# Patient Record
Sex: Female | Born: 1970 | Race: White | Hispanic: No | Marital: Single | Smoking: Former smoker
Health system: Southern US, Community
[De-identification: ages and names within clinical notes are randomized; demographics above are authoritative.]

## PROBLEM LIST (undated history)

## (undated) DIAGNOSIS — R011 Cardiac murmur, unspecified: Secondary | ICD-10-CM

## (undated) DIAGNOSIS — J439 Emphysema, unspecified: Secondary | ICD-10-CM

## (undated) DIAGNOSIS — M1990 Unspecified osteoarthritis, unspecified site: Secondary | ICD-10-CM

## (undated) DIAGNOSIS — I341 Nonrheumatic mitral (valve) prolapse: Secondary | ICD-10-CM

## (undated) DIAGNOSIS — R0902 Hypoxemia: Secondary | ICD-10-CM

## (undated) DIAGNOSIS — J449 Chronic obstructive pulmonary disease, unspecified: Secondary | ICD-10-CM

## (undated) HISTORY — DX: Cardiac murmur, unspecified: R01.1

## (undated) HISTORY — DX: Hypoxemia: R09.02

## (undated) HISTORY — DX: Emphysema, unspecified: J43.9

## (undated) HISTORY — DX: Unspecified osteoarthritis, unspecified site: M19.90

## (undated) HISTORY — DX: Chronic obstructive pulmonary disease, unspecified: J44.9

## (undated) HISTORY — PX: TYMPANOSTOMY TUBE PLACEMENT: SHX32

## (undated) HISTORY — PX: ABDOMINAL HYSTERECTOMY: SUR658

## (undated) HISTORY — PX: ADENOIDECTOMY: SUR15

---

## 1997-06-18 ENCOUNTER — Other Ambulatory Visit: Admission: RE | Admit: 1997-06-18 | Discharge: 1997-06-18 | Payer: Self-pay | Admitting: Obstetrics and Gynecology

## 1997-11-19 ENCOUNTER — Other Ambulatory Visit: Admission: RE | Admit: 1997-11-19 | Discharge: 1997-11-19 | Payer: Self-pay | Admitting: Obstetrics and Gynecology

## 1998-06-10 ENCOUNTER — Other Ambulatory Visit: Admission: RE | Admit: 1998-06-10 | Discharge: 1998-06-10 | Payer: Self-pay | Admitting: Obstetrics and Gynecology

## 1998-08-02 ENCOUNTER — Ambulatory Visit (HOSPITAL_COMMUNITY): Admission: RE | Admit: 1998-08-02 | Discharge: 1998-08-02 | Payer: Self-pay | Admitting: Obstetrics and Gynecology

## 1998-11-20 ENCOUNTER — Inpatient Hospital Stay (HOSPITAL_COMMUNITY): Admission: RE | Admit: 1998-11-20 | Discharge: 1998-11-21 | Payer: Self-pay | Admitting: Obstetrics and Gynecology

## 2016-05-05 ENCOUNTER — Emergency Department (HOSPITAL_COMMUNITY)
Admission: EM | Admit: 2016-05-05 | Discharge: 2016-05-05 | Disposition: A | Discharge status: Home / Self Care | Payer: Self-pay | Attending: Emergency Medicine | Admitting: Emergency Medicine

## 2016-05-05 ENCOUNTER — Encounter (HOSPITAL_COMMUNITY): Payer: Self-pay | Admitting: Emergency Medicine

## 2016-05-05 ENCOUNTER — Emergency Department (HOSPITAL_COMMUNITY): Payer: Self-pay

## 2016-05-05 DIAGNOSIS — J209 Acute bronchitis, unspecified: Secondary | ICD-10-CM

## 2016-05-05 DIAGNOSIS — F17200 Nicotine dependence, unspecified, uncomplicated: Secondary | ICD-10-CM | POA: Insufficient documentation

## 2016-05-05 HISTORY — DX: Nonrheumatic mitral (valve) prolapse: I34.1

## 2016-05-05 MED ORDER — ALBUTEROL SULFATE HFA 108 (90 BASE) MCG/ACT IN AERS
2.0000 | INHALATION_SPRAY | RESPIRATORY_TRACT | Status: DC | PRN
  Administered 2016-05-05: 2 via RESPIRATORY_TRACT
  Filled 2016-05-05: qty 6.7

## 2016-05-05 MED ORDER — PREDNISONE 10 MG PO TABS: 20.0000 mg | ORAL_TABLET | Freq: Two times a day (BID) | ORAL | 0 refills | Status: DC

## 2016-05-05 MED ORDER — IPRATROPIUM-ALBUTEROL 0.5-2.5 (3) MG/3ML IN SOLN
3.0000 mL | Freq: Once | RESPIRATORY_TRACT | Status: AC
  Administered 2016-05-05: 3 mL via RESPIRATORY_TRACT

## 2016-05-05 MED ORDER — DOXYCYCLINE HYCLATE 100 MG PO CAPS: 100.0000 mg | ORAL_CAPSULE | Freq: Two times a day (BID) | ORAL | 0 refills | Status: DC

## 2016-05-05 MED ORDER — IPRATROPIUM-ALBUTEROL 0.5-2.5 (3) MG/3ML IN SOLN
RESPIRATORY_TRACT | Status: AC
Start: 2016-05-05 — End: 2016-05-05
  Administered 2016-05-05: 3 mL via RESPIRATORY_TRACT
  Filled 2016-05-05: qty 3

## 2016-05-05 NOTE — ED Triage Notes (Signed)
Pt sts cough and pain with cough x 2 days; pt sts feels SOB

## 2016-05-05 NOTE — ED Provider Notes (Signed)
[facility]EMERGENCY DEPT Provider Note   CSN: [id number] Arrival date & time: 05/05/16  1117   By signing my name below, I, Soijett Blue, attest that this documentation has been prepared under the direction and in the presence of Margarita Grizzle, MD. Electronically Signed: Soijett Blue, ED Scribe. 05/05/16. 1:16 PM.  History   Chief Complaint Chief Complaint  Patient presents with  . Cough    HPI Jasmin Berger is a 46 y.o. female who presents to the Emergency Department complaining of productive cough x yellow sputum onset 2 days worsening last night. Pt reports associated SOB, rhinorrhea, chills, decreased appetite. Pt has not tried any medications for the relief of her symptoms. Denies obtaining her flu vaccination this past year or sick contacts with similar symptoms. She notes that she smoke cigarettes and has decreased her intake since the onset of her symptoms. She denies fever and any other symptoms. Denies taking daily medications, any pertinent PMHx, having a PCP, ETOH use, or allergies to medications.    The history is provided by the patient. No language interpreter was used.    Past Medical History:  Diagnosis Date  . Mitral valve prolapse     There are no active problems to display for this patient.   History reviewed. No pertinent surgical history.  OB History    No data available       Home Medications    Prior to Admission medications   Not on File    Family History History reviewed. No pertinent family history.  Social History Social History  Substance Use Topics  . Smoking status: Current Every Day Smoker  . Smokeless tobacco: Never Used  . Alcohol use Yes     Allergies   Patient has no known allergies.   Review of Systems Review of Systems  Constitutional: Positive for appetite change and chills. Negative for fever.  HENT: Positive for rhinorrhea.   Respiratory: Positive for cough and shortness of breath.   All other systems reviewed and  are negative.    Physical Exam Updated Vital Signs BP (!) 135/92 (BP Location: Right Arm)   Pulse (!) 101   Temp 98.6 F (37 C) (Oral)   Resp 20   SpO2 98%   Physical Exam  Constitutional: She is oriented to person, place, and time. She appears well-developed and well-nourished. No distress.  HENT:  Head: Normocephalic and atraumatic.  Mouth/Throat: Uvula is midline, oropharynx is clear and moist and mucous membranes are normal.  Eyes: Conjunctivae and EOM are normal. Pupils are equal, round, and reactive to light.  Neck: Neck supple.  Cardiovascular: Normal rate, regular rhythm and normal heart sounds.  Exam reveals no gallop and no friction rub.   No murmur heard. Heart rate 82  Pulmonary/Chest: Effort normal. No respiratory distress. She has no wheezes. She has rhonchi. She has no rales.  Rhonchi that cleared. Coughing on exam. No wheezing post breathing treatment  Abdominal: Soft. There is no tenderness.  Musculoskeletal: Normal range of motion.  Neurological: She is alert and oriented to person, place, and time.  Skin: Skin is warm and dry. She is not diaphoretic.  Psychiatric: She has a normal mood and affect. Her behavior is normal.  Nursing note and vitals reviewed.    ED Treatments / Results  DIAGNOSTIC STUDIES: Oxygen Saturation is 98% on RA, nl by my interpretation.    COORDINATION OF CARE: 1:15 PM Discussed treatment plan with pt at bedside which includes CXR, abx, steroid, smoking cessation, and  pt agreed to plan.   Radiology Dg Chest 2 View  Result Date: 05/05/2016 CLINICAL DATA:  Chest pain.  Shortness of breath . EXAM: CHEST  2 VIEW COMPARISON:  No recent prior . FINDINGS: Mediastinum and hilar structures are normal. Heart size normal. No focal infiltrate. Small bilateral pleural effusions versus pleural scarring. Thoracic spine scoliosis and degenerative change. IMPRESSION: 1.  Tiny bilateral pleural effusions versus pleural scarring. 2.  No acute  cardiopulmonary disease. Electronically Signed   By: Maisie Fus  Register   On: 05/05/2016 11:52    Procedures Procedures (including critical care time)  Medications Ordered in ED Medications  ipratropium-albuterol (DUONEB) 0.5-2.5 (3) MG/3ML nebulizer solution 3 mL (3 mLs Nebulization Given 05/05/16 1132)     Initial Impression / Assessment and Plan / ED Course  I have reviewed the triage vital signs and the nursing notes.  Pertinent imaging results that were available during my care of the patient were reviewed by me and considered in my medical decision making (see chart for details).       Final Clinical Impressions(s) / ED Diagnoses   Final diagnoses:  Acute bronchitis, unspecified organism    New Prescriptions New Prescriptions   No medications on file   I personally performed the services described in this documentation, which was scribed in my presence. The recorded information has been reviewed and considered.    Margarita Grizzle, MD 05/05/16 [phone]

## 2016-05-05 NOTE — ED Notes (Signed)
Pt ambulatory at DC, verbalizes DC teaching and medications. NAD. VSS.

## 2016-05-07 ENCOUNTER — Telehealth: Payer: Self-pay | Admitting: *Deleted

## 2016-05-07 NOTE — Telephone Encounter (Signed)
Pt called concerned about number of pills given.  Pt thought she was to take Rx for 10 days, EDCM researched chart to find that pt was given 10 pills to take BID which would equal 5 days.  Pt appreciative; no further CM needs identified at this time.

## 2017-05-01 IMAGING — DX DG CHEST 2V
2 series · 2 of 2 positions shown · non-contrast
Comparison: No recent prior .

CLINICAL DATA: Chest pain.  Shortness of breath .

EXAM:
CHEST  2 VIEW

[chest pa]
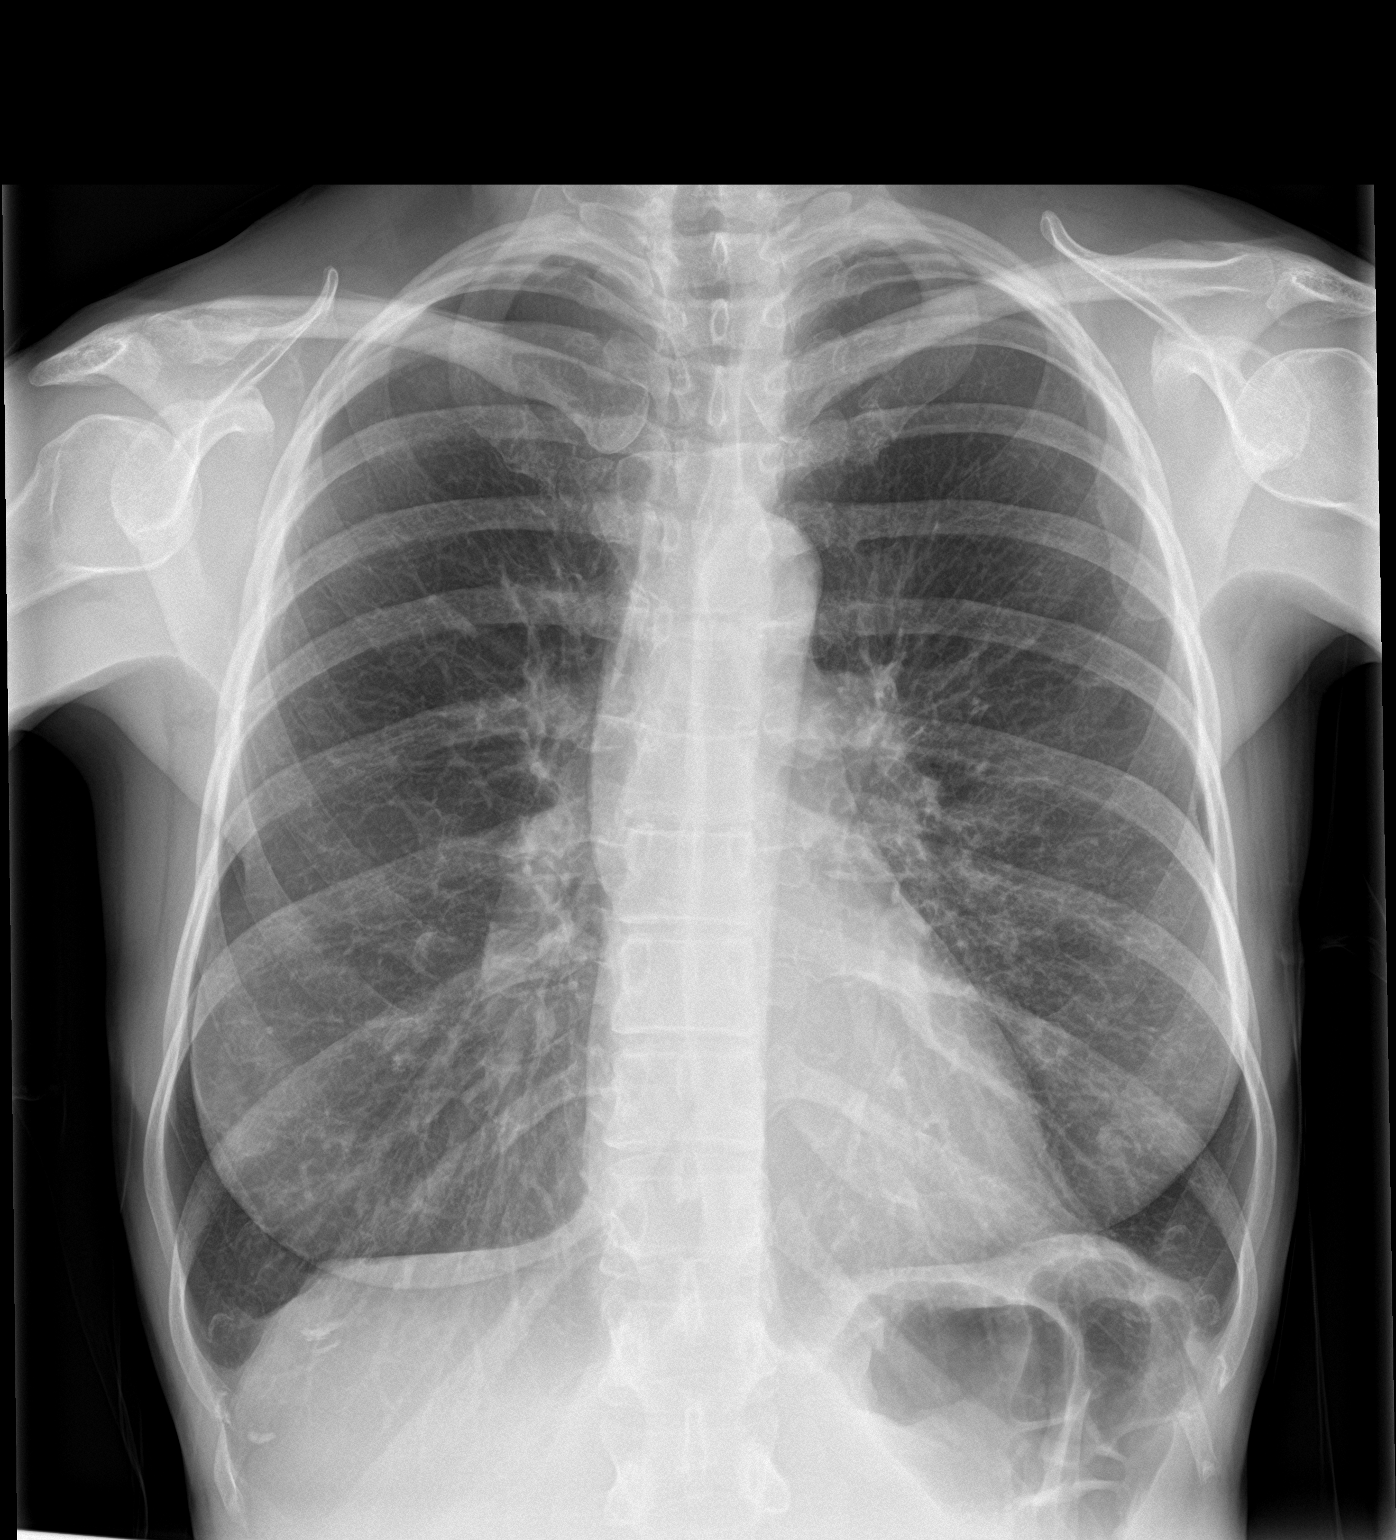

[chest lat]
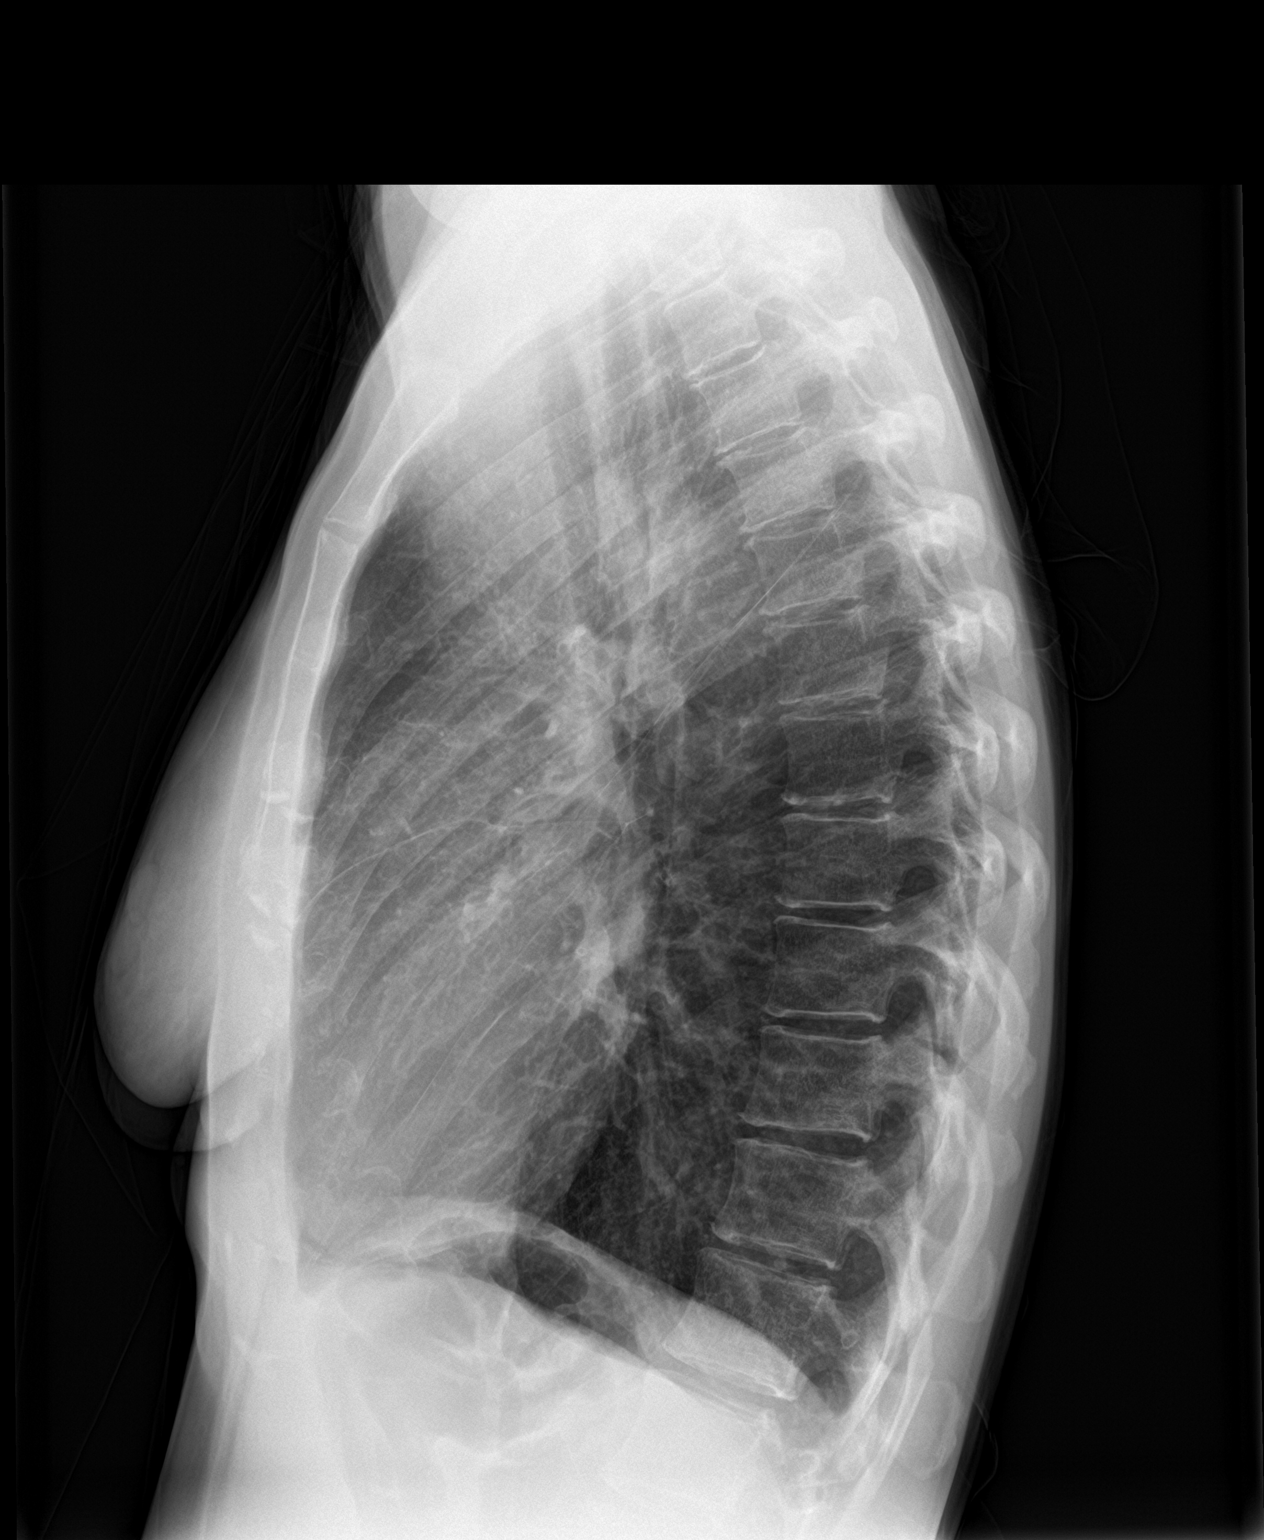

[2 of 2 positions shown; findings below may reference images not displayed]

FINDINGS: Mediastinum and hilar structures are normal. Heart size normal. No
focal infiltrate. Small bilateral pleural effusions versus pleural
scarring. Thoracic spine scoliosis and degenerative change.
IMPRESSION: 1.  Tiny bilateral pleural effusions versus pleural scarring.

2.  No acute cardiopulmonary disease.

## 2022-04-03 DIAGNOSIS — Z419 Encounter for procedure for purposes other than remedying health state, unspecified: Secondary | ICD-10-CM | POA: Diagnosis not present

## 2022-05-04 DIAGNOSIS — Z419 Encounter for procedure for purposes other than remedying health state, unspecified: Secondary | ICD-10-CM | POA: Diagnosis not present

## 2022-05-21 ENCOUNTER — Emergency Department (HOSPITAL_COMMUNITY)
Admission: EM | Admit: 2022-05-21 | Discharge: 2022-05-21 | Disposition: A | Discharge status: Home / Self Care | Payer: Medicaid Other | Attending: Emergency Medicine | Admitting: Emergency Medicine

## 2022-05-21 ENCOUNTER — Encounter (HOSPITAL_COMMUNITY): Payer: Self-pay

## 2022-05-21 ENCOUNTER — Ambulatory Visit (INDEPENDENT_AMBULATORY_CARE_PROVIDER_SITE_OTHER): Payer: Medicaid Other

## 2022-05-21 ENCOUNTER — Ambulatory Visit
Admission: EM | Admit: 2022-05-21 | Discharge: 2022-05-21 | Disposition: A | Discharge status: Home / Self Care | Payer: Medicaid Other | Attending: Family Medicine | Admitting: Family Medicine

## 2022-05-21 ENCOUNTER — Other Ambulatory Visit: Payer: Self-pay

## 2022-05-21 ENCOUNTER — Emergency Department (HOSPITAL_COMMUNITY): Payer: Medicaid Other

## 2022-05-21 DIAGNOSIS — R059 Cough, unspecified: Secondary | ICD-10-CM | POA: Diagnosis not present

## 2022-05-21 DIAGNOSIS — Z743 Need for continuous supervision: Secondary | ICD-10-CM | POA: Diagnosis not present

## 2022-05-21 DIAGNOSIS — J449 Chronic obstructive pulmonary disease, unspecified: Secondary | ICD-10-CM | POA: Diagnosis not present

## 2022-05-21 DIAGNOSIS — Z1152 Encounter for screening for COVID-19: Secondary | ICD-10-CM | POA: Diagnosis not present

## 2022-05-21 DIAGNOSIS — R0602 Shortness of breath: Secondary | ICD-10-CM | POA: Diagnosis not present

## 2022-05-21 DIAGNOSIS — J9601 Acute respiratory failure with hypoxia: Secondary | ICD-10-CM

## 2022-05-21 DIAGNOSIS — R0902 Hypoxemia: Secondary | ICD-10-CM | POA: Insufficient documentation

## 2022-05-21 DIAGNOSIS — R062 Wheezing: Secondary | ICD-10-CM | POA: Diagnosis not present

## 2022-05-21 DIAGNOSIS — J441 Chronic obstructive pulmonary disease with (acute) exacerbation: Secondary | ICD-10-CM | POA: Insufficient documentation

## 2022-05-21 LAB — COMPREHENSIVE METABOLIC PANEL
ALT: 19 U/L (ref 0–44)
AST: 35 U/L (ref 15–41)
Albumin: 3.5 g/dL (ref 3.5–5.0)
Alkaline Phosphatase: 69 U/L (ref 38–126)
Anion gap: 9 (ref 5–15)
BUN: 13 mg/dL (ref 6–20)
CO2: 24 mmol/L (ref 22–32)
Calcium: 8.6 mg/dL — ABNORMAL LOW (ref 8.9–10.3)
Chloride: 97 mmol/L — ABNORMAL LOW (ref 98–111)
Creatinine, Ser: 0.53 mg/dL (ref 0.44–1.00)
GFR, Estimated: >60 mL/min (ref >60)
Glucose, Bld: 110 mg/dL — ABNORMAL HIGH (ref 70–99)
Potassium: 4.1 mmol/L (ref 3.5–5.1)
Sodium: 130 mmol/L — ABNORMAL LOW (ref 135–145)
Total Bilirubin: 0.4 mg/dL (ref 0.3–1.2)
Total Protein: 7.5 g/dL (ref 6.5–8.1)

## 2022-05-21 LAB — BLOOD GAS, VENOUS
Acid-Base Excess: 3.5 mmol/L — ABNORMAL HIGH (ref 0.0–2.0)
Bicarbonate: 29.1 mmol/L — ABNORMAL HIGH (ref 20.0–28.0)
O2 Saturation: 52.7 %
Patient temperature: 37
pCO2, Ven: 47 mmHg (ref 44–60)
pH, Ven: 7.4 (ref 7.25–7.43)
pO2, Ven: <31 mmHg — CL (ref 32–45)

## 2022-05-21 LAB — CBC WITH DIFFERENTIAL/PLATELET
Abs Immature Granulocytes: 0.05 10*3/uL (ref 0.00–0.07)
Basophils Absolute: 0 10*3/uL (ref 0.0–0.1)
Basophils Relative: 1 %
Eosinophils Absolute: 0 10*3/uL (ref 0.0–0.5)
Eosinophils Relative: 1 %
HCT: 41.4 % (ref 36.0–46.0)
Hemoglobin: 14.1 g/dL (ref 12.0–15.0)
Immature Granulocytes: 1 %
Lymphocytes Relative: 14 %
Lymphs Abs: 0.9 10*3/uL (ref 0.7–4.0)
MCH: 34 pg (ref 26.0–34.0)
MCHC: 34.1 g/dL (ref 30.0–36.0)
MCV: 99.8 fL (ref 80.0–100.0)
Monocytes Absolute: 0.7 10*3/uL (ref 0.1–1.0)
Monocytes Relative: 11 %
Neutro Abs: 4.7 10*3/uL (ref 1.7–7.7)
Neutrophils Relative %: 72 %
Platelets: 238 10*3/uL (ref 150–400)
RBC: 4.15 MIL/uL (ref 3.87–5.11)
RDW: 12.9 % (ref 11.5–15.5)
WBC: 6.4 10*3/uL (ref 4.0–10.5)
nRBC: 0 % (ref 0.0–0.2)

## 2022-05-21 LAB — SARS CORONAVIRUS 2 BY RT PCR: SARS Coronavirus 2 by RT PCR: NEGATIVE

## 2022-05-21 MED ORDER — MAGNESIUM SULFATE 2 GM/50ML IV SOLN
2.0000 g | Freq: Once | INTRAVENOUS | Status: AC
  Administered 2022-05-21: 2 g via INTRAVENOUS
  Filled 2022-05-21: qty 50

## 2022-05-21 MED ORDER — AEROCHAMBER Z-STAT PLUS/MEDIUM MISC
1.0000 | Freq: Once | Status: AC
  Administered 2022-05-21: 1

## 2022-05-21 MED ORDER — ALBUTEROL SULFATE (2.5 MG/3ML) 0.083% IN NEBU
2.5000 mg | INHALATION_SOLUTION | Freq: Once | RESPIRATORY_TRACT | Status: AC
  Administered 2022-05-21: 2.5 mg via RESPIRATORY_TRACT

## 2022-05-21 MED ORDER — AEROCHAMBER PLUS FLO-VU MEDIUM MISC: 1.0000 | Freq: Once | Status: DC

## 2022-05-21 MED ORDER — IPRATROPIUM BROMIDE 0.02 % IN SOLN
1.0000 mg | Freq: Once | RESPIRATORY_TRACT | Status: AC
  Administered 2022-05-21: 1 mg via RESPIRATORY_TRACT
  Filled 2022-05-21: qty 5

## 2022-05-21 MED ORDER — ALBUTEROL SULFATE (2.5 MG/3ML) 0.083% IN NEBU
15.0000 mg/h | INHALATION_SOLUTION | Freq: Once | RESPIRATORY_TRACT | Status: AC
  Administered 2022-05-21: 15 mg/h via RESPIRATORY_TRACT
  Filled 2022-05-21: qty 3

## 2022-05-21 MED ORDER — AEROCHAMBER Z-STAT PLUS/MEDIUM MISC
Status: AC
  Filled 2022-05-21: qty 1

## 2022-05-21 MED ORDER — PREDNISONE 20 MG PO TABS: ORAL_TABLET | ORAL | 0 refills | Status: DC

## 2022-05-21 MED ORDER — ALBUTEROL SULFATE (2.5 MG/3ML) 0.083% IN NEBU
INHALATION_SOLUTION | RESPIRATORY_TRACT | Status: AC
  Filled 2022-05-21: qty 3

## 2022-05-21 MED ORDER — ALBUTEROL SULFATE HFA 108 (90 BASE) MCG/ACT IN AERS
2.0000 | INHALATION_SPRAY | Freq: Once | RESPIRATORY_TRACT | Status: AC
  Administered 2022-05-21: 2 via RESPIRATORY_TRACT
  Filled 2022-05-21: qty 6.7

## 2022-05-21 NOTE — Discharge Instructions (Signed)
We gave you 1 treatment of albuterol here in the office.  I am glad it helps you feel better, but your oxygenation did not improve.  Your oxygen saturation on room air was 87%.

## 2022-05-21 NOTE — ED Notes (Signed)
Per Dr. Marlinda Mike, patient needs higher level of care due to low oxygen saturations and difficulty breathing. [facility] called at this time.

## 2022-05-21 NOTE — Progress Notes (Signed)
SATURATION QUALIFICATIONS: (This note is used to comply with regulatory documentation for home oxygen)  Patient Saturations on Room Air at Rest = 87%  Patient Saturations on Room Air while Ambulating = 79%  Patient Saturations on 3 Liters of oxygen while Ambulating = 96%  Please briefly explain why patient needs home oxygen: Patient presented to emergency department with c/o ongoing shortness of breath  Karoline Caldwell, BSN, RN, CCM

## 2022-05-21 NOTE — ED Triage Notes (Signed)
Patient with c/o shortness of breath that worsened today. States she was sick over the weekend and today things became worse. 87% on room air.

## 2022-05-21 NOTE — ED Provider Notes (Signed)
[facility]    CSN: [id number] Arrival date & time: 05/21/22  1226      History   Chief Complaint Chief Complaint  Patient presents with   Shortness of Breath    HPI Jasmin Berger is a 52 y.o. female.    Shortness of Breath  Here for shortness of breath that began on April 15 and worsened this morning.  On April 12 she began having sore throat and cough and congestion.  No fever at any point.  She has used an inhaler in the past for "bronchitis".  Past Medical History:  Diagnosis Date   Mitral valve prolapse     There are no problems to display for this patient.   No past surgical history on file.  OB History   No obstetric history on file.      Home Medications    Prior to Admission medications   Medication Sig Start Date End Date Taking? Authorizing Provider  doxycycline (VIBRAMYCIN) 100 MG capsule Take 1 capsule (100 mg total) by mouth 2 (two) times daily. 05/05/16   Margarita Grizzle, MD  predniSONE (DELTASONE) 10 MG tablet Take 2 tablets (20 mg total) by mouth 2 (two) times daily with a meal. 05/05/16   Margarita Grizzle, MD    Family History No family history on file.  Social History Social History   Tobacco Use   Smoking status: Every Day   Smokeless tobacco: Never  Substance Use Topics   Alcohol use: Yes   Drug use: No     Allergies   Patient has no known allergies.   Review of Systems Review of Systems  Respiratory:  Positive for shortness of breath.      Physical Exam Triage Vital Signs ED Triage Vitals [05/21/22 1241]  Enc Vitals Group     BP      Pulse Rate (!) 117     Resp (!) 26     Temp 98.4 F (36.9 C)     Temp Source Oral     SpO2 (!) 87 %     Weight      Height      Head Circumference      Peak Flow      Pain Score 0     Pain Loc      Pain Edu?      Excl. in GC?    No data found.  Updated Vital Signs Pulse (!) 117   Temp 98.4 F (36.9 C) (Oral)   Resp (!) 26   SpO2 (!) 87%   Visual  Acuity Right Eye Distance:   Left Eye Distance:   Bilateral Distance:    Right Eye Near:   Left Eye Near:    Bilateral Near:     Physical Exam Vitals reviewed.  Constitutional:      Appearance: She is not toxic-appearing.     Comments: She is somewhat air hungry in the exam room.  Her initial O2 sat on room air is 88%  HENT:     Nose: Nose normal.     Mouth/Throat:     Mouth: Mucous membranes are moist.     Comments: There is yellow and white mucus draining in the posterior oropharynx Eyes:     Extraocular Movements: Extraocular movements intact.     Conjunctiva/sclera: Conjunctivae normal.     Pupils: Pupils are equal, round, and reactive to light.  Cardiovascular:     Rate and Rhythm: Normal rate and regular rhythm.  Heart sounds: No murmur heard. Pulmonary:     Effort: No respiratory distress.     Breath sounds: No stridor. No rhonchi or rales.     Comments: There is expiratory wheezing heard, mainly in the upper lung fields bilaterally Musculoskeletal:     Cervical back: Neck supple.  Lymphadenopathy:     Cervical: No cervical adenopathy.  Skin:    Capillary Refill: Capillary refill takes less than 2 seconds.     Coloration: Skin is not jaundiced or pale.  Neurological:     General: No focal deficit present.     Mental Status: She is alert and oriented to person, place, and time.  Psychiatric:        Behavior: Behavior normal.      UC Treatments / Results  Labs (all labs ordered are listed, but only abnormal results are displayed) Labs Reviewed - No data to display  EKG   Radiology No results found.  Procedures Procedures (including critical care time)  Medications Ordered in UC Medications - No data to display  Initial Impression / Assessment and Plan / UC Course  I have reviewed the triage vital signs and the nursing notes.  Pertinent labs & imaging results that were available during my care of the patient were reviewed by me and considered  in my medical decision making (see chart for details).         After breathing treatment she does feel better.  O2 sat had got up to 97% on oxygen.  Chest x-ray is negative for infiltrate or fluid.  There are signs of COPD on the x-ray.  We tried taking the oxygen off twice, first time about 5 minutes after her albuterol, and then the next time about 45 minutes after her albuterol treatment.  Both times her oxygen saturation dipped to 87% on room air.  We have placed her back on oxygen, and she will be transported to the ED by [facility] for further evaluation.  Final Clinical Impressions(s) / UC Diagnoses   Final diagnoses:  None   Discharge Instructions   None    ED Prescriptions   None    PDMP not reviewed this encounter.   Zenia Resides, MD 05/21/22 [phone]

## 2022-05-21 NOTE — ED Notes (Addendum)
Ambulated pt, pt desat to 79%

## 2022-05-21 NOTE — ED Notes (Signed)
Patient room air sats 87%. Dr. Marlinda Mike notified. Patient placed on 2L [place]. Sats 90% on 2L [place].

## 2022-05-21 NOTE — Care Management (Addendum)
Transition of Care [facility]) - Emergency Department Mini Assessment   Patient Details  Name: Jasmin Berger MRN: [id number] Date of Birth: 11-02-1970  Transition of Care [facility]) CM/SW Contact:    Lavenia Atlas, RN Phone Number: 05/21/2022, 6:19 PM   Clinical Narrative: Patient presents to [facility] ED with c/o shortness of breath. This RNCM received TOC consult for home oxygen. Notified EDP if patient is not admitted awaiting DME vendor [employer] to respond re: ability to deliver home oxygen to [facility] ED tonight. Notified EDP that patient will need a walking sats note co-signed by EDP.   TOC will continue to follow.   -6:20pm This RNCM spoke with Jermaine with [employer] who reports he can have tank delivered tonight however will need home oxygen orders and EDP cosigned walking sats note. Awaiting sats note and home O2 orders.   TOC will continue to follow  - 6:50pm This RNCM spoke with patient and friend at bedside. Patient reports she does not wish to be admitted and was in agreement with receiving home oxygen. Notified Jermaine with [employer] that home oxygen order and ambulatory sats note is in. [employer] will deliver portable tank to patient at bedside prior to discharge.  EDP, RN notified.  No additional TOC needs at this time.   ED Mini Assessment: What brought you to the Emergency Department? : shortness of breath  Barriers to Discharge: ED DME delivery  Barrier interventions: awaiting call back from DME supplier for home oxygen     Interventions which prevented an admission or readmission: Other (must enter comment) (potential home oxygen)    Patient Contact and Communications        ,            CMS Medicare.gov Compare Post Acute Care list provided to:: Patient Choice offered to / list presented to : Patient  Admission diagnosis:  SOB There are no problems to display for this patient.  PCP:  Patient, No Pcp Per Pharmacy:   [facility] - [place], [place] - [address] [place] [address] Phone: [phone] Fax: [phone]

## 2022-05-21 NOTE — Discharge Instructions (Signed)
Take prednisone as prescribed.  Use albuterol every 4 hours as needed for shortness of breath  You need to wear 3 L nasal cannula at all times  You need to call your primary care doctor tomorrow for appointment this week  Return to ER if you have worse shortness of breath or cough or fever or trouble breathing

## 2022-05-21 NOTE — ED Provider Notes (Signed)
[place] EMERGENCY DEPARTMENT AT [facility] Provider Note   CSN: [id number] Arrival date & time: 05/21/22  1517     History  Chief Complaint  Patient presents with   Shortness of Breath    Jasmin Berger is a 52 y.o. female smoker here presenting with shortness of breath.  Patient states that she smoked 1 pack/day for years now.  She states that she does not see a doctor so was not formally diagnosed with COPD.  She went to urgent care today and was noted to be hypoxic with oxygen of 87%.  Patient is not on oxygen at home.  Denies any fevers.  Patient has nonproductive cough.  The history is provided by the patient.       Home Medications Prior to Admission medications   Not on File      Allergies    Patient has no known allergies.    Review of Systems   Review of Systems  Respiratory:  Positive for shortness of breath.   All other systems reviewed and are negative.   Physical Exam Updated Vital Signs SpO2 96% Comment: 8L nonrebreathier Physical Exam Vitals and nursing note reviewed.  Constitutional:      Comments: Tachypneic  HENT:     Head: Normocephalic.     Mouth/Throat:     Mouth: Mucous membranes are moist.  Eyes:     Extraocular Movements: Extraocular movements intact.     Pupils: Pupils are equal, round, and reactive to light.  Cardiovascular:     Rate and Rhythm: Normal rate and regular rhythm.  Pulmonary:     Comments: Tachypneic and mild diffuse wheezing.  Mild retractions Abdominal:     General: Bowel sounds are normal.     Palpations: Abdomen is soft.  Musculoskeletal:        General: Normal range of motion.     Cervical back: Normal range of motion and neck supple.  Skin:    General: Skin is warm.     Capillary Refill: Capillary refill takes less than 2 seconds.  Neurological:     General: No focal deficit present.     Mental Status: She is oriented to person, place, and time.  Psychiatric:        Mood and Affect: Mood  normal.        Behavior: Behavior normal.     ED Results / Procedures / Treatments   Labs (all labs ordered are listed, but only abnormal results are displayed) Labs Reviewed  COMPREHENSIVE METABOLIC PANEL - Abnormal; Notable for the following components:      Result Value   Sodium 130 (*)    Chloride 97 (*)    Glucose, Bld 110 (*)    Calcium 8.6 (*)    All other components within normal limits  SARS CORONAVIRUS 2 BY RT PCR  CBC WITH DIFFERENTIAL/PLATELET  BLOOD GAS, VENOUS  I-STAT VENOUS BLOOD GAS, ED    EKG None  Radiology DG Chest Port 1 View  Result Date: 05/21/2022 CLINICAL DATA:  Shortness of breath. EXAM: PORTABLE CHEST 1 VIEW COMPARISON:  05/21/2022. FINDINGS: Hyperinflated lungs without focal airspace opacity. Stable cardiac and mediastinal contours. No pleural effusion or pneumothorax. Visualized bones and upper abdomen are unremarkable. IMPRESSION: No evidence of acute cardiopulmonary disease. Electronically Signed   By: Orvan Falconer M.D.   On: 05/21/2022 16:05   DG Chest 2 View  Result Date: 05/21/2022 CLINICAL DATA:  Shortness of breath, wheezing, and cough for 1 week.  Hypoxia. EXAM: CHEST - 2 VIEW COMPARISON:  05/05/2016 FINDINGS: The heart size and mediastinal contours are within normal limits. Marked pulmonary hyperinflation is again seen, consistent with COPD. Both lungs are clear. The visualized skeletal structures are unremarkable. IMPRESSION: COPD. No active cardiopulmonary disease. Electronically Signed   By: Danae Orleans M.D.   On: 05/21/2022 13:50    Procedures Procedures    CRITICAL CARE Performed by: Richardean Canal   Total critical care time: 32 minutes  Critical care time was exclusive of separately billable procedures and treating other patients.  Critical care was necessary to treat or prevent imminent or life-threatening deterioration.  Critical care was time spent personally by me on the following activities: development of treatment plan  with patient and/or surrogate as well as nursing, discussions with consultants, evaluation of patient's response to treatment, examination of patient, obtaining history from patient or surrogate, ordering and performing treatments and interventions, ordering and review of laboratory studies, ordering and review of radiographic studies, pulse oximetry and re-evaluation of patient's condition.   Medications Ordered in ED Medications  magnesium sulfate IVPB 2 g 50 mL (2 g Intravenous New Bag/Given 05/21/22 1639)  ipratropium (ATROVENT) nebulizer solution 1 mg (has no administration in time range)  albuterol (PROVENTIL) (2.5 MG/3ML) 0.083% nebulizer solution (has no administration in time range)  albuterol (PROVENTIL) (2.5 MG/3ML) 0.083% nebulizer solution (15 mg/hr Nebulization Given 05/21/22 1649)    ED Course/ Medical Decision Making/ A&P                             Medical Decision Making Jasmin Berger is a 52 y.o. female here presenting with shortness of breath.  Patient is a smoker and I presume that she has COPD.  She has not seen her doctor for years so was never formally diagnosed with it.  Patient is wheezing throughout.  Will check labs and get chest x-ray and VBG and COVID test.  Will give continuous neb and Solu-Medrol and magnesium.  5 pm Patient continues to be hypoxic with oxygen of 87% on room air.  I felt that patient needs to be admitted but she is adamant that she does not want to stay.  Patient has no oxygen at home so I messaged the case manager to get her oxygen.  Patient also desats to 79% on room air.   7:50 PM Oxygen is delivered to bedside.  I again emphasized to patient that she should get admitted but she is adamant that she wants to go home.  She states that she will call her PCP tomorrow.  I told her to use 3 L nasal cannula at all time and she needs to call her primary care doctor and get follow-up this week.  Will also send her home with albuterol and prednisone  taper   Problems Addressed: COPD with acute exacerbation: acute illness or injury Hypoxia: acute illness or injury  Amount and/or Complexity of Data Reviewed Labs: ordered. Decision-making details documented in ED Course. Radiology: ordered and independent interpretation performed. Decision-making details documented in ED Course. ECG/medicine tests: ordered.  Risk Prescription drug management.    Final Clinical Impression(s) / ED Diagnoses Final diagnoses:  None    Rx / DC Orders ED Discharge Orders     None         Charlynne Pander, MD 05/21/22 [phone]

## 2022-05-21 NOTE — ED Triage Notes (Signed)
Arrived via EMS for Possible copd excerebration from UC, 02 at 87% at UC, bi-lat wheezing, 1 albuterol tx given at UC, duoneb given EMS, 125 of solumedrol given by EMS, 20 in left hand, 95 HR 136/96, 96 on 8L.Marland Kitchen  Pt stated she was sick last week, started Friday morning, woke up with sore throat, cough, running nose and eyes watering.

## 2022-05-22 DIAGNOSIS — J449 Chronic obstructive pulmonary disease, unspecified: Secondary | ICD-10-CM | POA: Diagnosis not present

## 2022-05-22 DIAGNOSIS — R0902 Hypoxemia: Secondary | ICD-10-CM | POA: Diagnosis not present

## 2022-05-22 DIAGNOSIS — R0602 Shortness of breath: Secondary | ICD-10-CM | POA: Diagnosis not present

## 2022-05-27 ENCOUNTER — Ambulatory Visit (INDEPENDENT_AMBULATORY_CARE_PROVIDER_SITE_OTHER): Payer: Medicaid Other | Admitting: Family Medicine

## 2022-05-27 ENCOUNTER — Encounter: Payer: Self-pay | Admitting: Family Medicine

## 2022-05-27 VITALS — BP 120/76 | HR 88 | Ht 64.0 in | Wt 107.8 lb

## 2022-05-27 DIAGNOSIS — J449 Chronic obstructive pulmonary disease, unspecified: Secondary | ICD-10-CM

## 2022-05-27 DIAGNOSIS — E871 Hypo-osmolality and hyponatremia: Secondary | ICD-10-CM

## 2022-05-27 DIAGNOSIS — Z13220 Encounter for screening for lipoid disorders: Secondary | ICD-10-CM | POA: Diagnosis not present

## 2022-05-27 DIAGNOSIS — Z7689 Persons encountering health services in other specified circumstances: Secondary | ICD-10-CM | POA: Diagnosis not present

## 2022-05-27 DIAGNOSIS — Z0000 Encounter for general adult medical examination without abnormal findings: Secondary | ICD-10-CM

## 2022-05-27 DIAGNOSIS — F1010 Alcohol abuse, uncomplicated: Secondary | ICD-10-CM | POA: Diagnosis not present

## 2022-05-27 DIAGNOSIS — E878 Other disorders of electrolyte and fluid balance, not elsewhere classified: Secondary | ICD-10-CM

## 2022-05-27 DIAGNOSIS — Z131 Encounter for screening for diabetes mellitus: Secondary | ICD-10-CM | POA: Diagnosis not present

## 2022-05-27 MED ORDER — ALBUTEROL SULFATE HFA 108 (90 BASE) MCG/ACT IN AERS: 2.0000 | INHALATION_SPRAY | RESPIRATORY_TRACT | 11 refills | Status: DC | PRN

## 2022-05-27 MED ORDER — STIOLTO RESPIMAT 2.5-2.5 MCG/ACT IN AERS: 2.0000 | INHALATION_SPRAY | Freq: Every day | RESPIRATORY_TRACT | 5 refills | Status: DC

## 2022-05-27 NOTE — Assessment & Plan Note (Signed)
Has no history of seeing a medical provider as an adult.  After we finish the initial management of her lung disease we will discuss healthcare screening such as mammograms, colonoscopies, Pap smears.  We will get lipid panel and A1c today. - Follow-up 1 month.

## 2022-05-27 NOTE — Progress Notes (Signed)
New Patient Office Visit  Subjective    Patient ID: Jasmin Berger, female    DOB: [birth year]  Age: 52 y.o. MRN: [id number]  CC:  Chief Complaint  Patient presents with   Establish Care    HPI Jasmin Berger presents to establish care  Patient has not had a primary care provider as an adult.  She has been diagnosed with mitral valve prolapse as a teenager and had a hysterectomy for endometriosis approximately 20 years ago.  Patient was a chronic smoker and daily alcohol user up until last Friday when she quit both after going to the hospital.  She has been compliant with her inhaler and medication since that time.  She finished her prednisone.  She uses her albuterol every 4 hours.   PMH: mitral valve prolapse.   PSH: no surgeries.   FH: N/A  Tobacco use: quit last Friday.  Quit on the 19th.  1.5 ppd x for 30 years.   Alcohol use: stopped alcohol Friday.  Daily beer drinking 6 or more.  No withdrawals with previous.   Drug use: no Marital status: not married.   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  Quit due to health concerns.   Sexual hx:  Partial hysterectomy 2001 d/t endometriosis.   Has 2 children 14 and 82 yo.      Outpatient Encounter Medications as of 05/27/2022  Medication Sig   albuterol (VENTOLIN HFA) 108 (90 Base) MCG/ACT inhaler Inhale 2 puffs into the lungs every 4 (four) hours as needed for wheezing or shortness of breath.   predniSONE (DELTASONE) 20 MG tablet Take 60 mg daily x 2 days then 40 mg daily x 2 days then 20 mg daily x 2 days   Tiotropium Bromide-Olodaterol (STIOLTO RESPIMAT) 2.5-2.5 MCG/ACT AERS Inhale 2 puffs into the lungs daily.   No facility-administered encounter medications on file as of 05/27/2022.    Past Medical History:  Diagnosis Date   Mitral valve prolapse     No past surgical history on file.  No family history on file.  Social History   Socioeconomic History    Marital status: Married    Spouse name: Not on file   Number of children: Not on file   Years of education: Not on file   Highest education level: Not on file  Occupational History   Not on file  Tobacco Use   Smoking status: Every Day    Packs/day: 1    Types: Cigarettes   Smokeless tobacco: Never   Tobacco comments:    Pt states she quit last friday  Vaping Use   Vaping Use: Never used  Substance and Sexual Activity   Alcohol use: Yes   Drug use: No   Sexual activity: Not on file  Other Topics Concern   Not on file  Social History Narrative   Not on file   Social Determinants of Health   Financial Resource Strain: Not on file  Food Insecurity: Not on file  Transportation Needs: Not on file  Physical Activity: Not on file  Stress: Not on file  Social Connections: Not on file  Intimate Partner Violence: Not on file    ROS      Objective    BP 120/76   Pulse 88   Ht  (1.626 m)   Wt 107 lb 12 oz (48.9 kg)   SpO2 95% Comment:   BMI 18.50 kg/m   Physical Exam General:  Alert and oriented.  Thin-appearing female, appears older than stated age HEENT: Poor dentition, normal oropharynx, normal TM CV: Regular rate and rhythm no murmurs Pulmonary: Tight airways, no wheezing.  Wearing oxygen.  Coughing. GI: Soft, nontender MSK: Strength equal bilaterally Neuro: Cranial nerves II through XII grossly intact Psych: Pleasant affect      Assessment & Plan:   Problem List Items Addressed This Visit       Respiratory   Chronic obstructive pulmonary disease    Patient has no formal diagnosis yet due to not seeking out medical providers in the past.  Was sent home on oxygen in the ED last week.  Chest x-ray consistent with someone who has COPD.  Patient continues to have cough. - Ambulate referral to pulmonology - CT chest - Daily maintenance inhaler: Stiolto - Refilled albuterol - Counseled on smoking cessation, which patient has been compliant with  since Friday.      Relevant Medications   Tiotropium Bromide-Olodaterol (STIOLTO RESPIMAT) 2.5-2.5 MCG/ACT AERS   albuterol (VENTOLIN HFA) 108 (90 Base) MCG/ACT inhaler   Other Relevant Orders   Ambulatory referral to Pulmonology   CT Chest Wo Contrast     Other   Alcohol abuse - Primary    Counseled on cessation.  Has not been at least 5 days since she last used alcohol.  Prior to that was a daily drinker of 6 or more beers a day.  No symptoms of withdrawal.  Has not had withdrawal issues in the past when quitting.  LFTs were normal.      Electrolyte disturbance   Relevant Orders   Basic Metabolic Panel (BMET)   Magnesium   Encounter to establish care    Has no history of seeing a medical provider as an adult.  After we finish the initial management of her lung disease we will discuss healthcare screening such as mammograms, colonoscopies, Pap smears.  We will get lipid panel and A1c today. - Follow-up 1 month.      Hyponatremia    Likely due to chronic alcohol use - Follow-up repeat BMP      Other Visit Diagnoses     Healthcare maintenance       Relevant Orders   Lipid Profile   HgB A1c       Return in about 4 weeks (around 06/24/2022) for Pulmonary issues, health maintenance.   Sandre Kitty, MD

## 2022-05-27 NOTE — Assessment & Plan Note (Signed)
Likely due to chronic alcohol use - Follow-up repeat BMP

## 2022-05-27 NOTE — Patient Instructions (Signed)
It was nice to meet you today.  We did the following things: - We are getting some labs for routine screenings and also due to abnormal labs from your visit to the ED. - We are referring you to the pulmonologist and ordering a CT scan of your chest due to the cough and likely COPD - I am going to fill a prescription for a daily inhaler.  Please use as directed - I will refill a prescription for albuterol.  Please use as needed.  I would like you to follow-up in 1 month from now  Have a great day  Frederic Jericho, MD

## 2022-05-27 NOTE — Assessment & Plan Note (Signed)
Counseled on cessation.  Has not been at least 5 days since she last used alcohol.  Prior to that was a daily drinker of 6 or more beers a day.  No symptoms of withdrawal.  Has not had withdrawal issues in the past when quitting.  LFTs were normal.

## 2022-05-27 NOTE — Assessment & Plan Note (Signed)
Patient has no formal diagnosis yet due to not seeking out medical providers in the past.  Was sent home on oxygen in the ED last week.  Chest x-ray consistent with someone who has COPD.  Patient continues to have cough. - Ambulate referral to pulmonology - CT chest - Daily maintenance inhaler: Stiolto - Refilled albuterol - Counseled on smoking cessation, which patient has been compliant with since Friday.

## 2022-05-28 LAB — BASIC METABOLIC PANEL
BUN/Creatinine Ratio: 28 — ABNORMAL HIGH (ref 9–23)
BUN: 17 mg/dL (ref 6–24)
CO2: 26 mmol/L (ref 20–29)
Calcium: 9.5 mg/dL (ref 8.7–10.2)
Chloride: 95 mmol/L — ABNORMAL LOW (ref 96–106)
Creatinine, Ser: 0.61 mg/dL (ref 0.57–1.00)
Glucose: 87 mg/dL (ref 70–99)
Potassium: 4.5 mmol/L (ref 3.5–5.2)
Sodium: 137 mmol/L (ref 134–144)
eGFR: 107 mL/min/{1.73_m2} (ref >59)

## 2022-05-28 LAB — LIPID PANEL
Chol/HDL Ratio: 2.2 ratio (ref 0.0–4.4)
Cholesterol, Total: 185 mg/dL (ref 100–199)
HDL: 83 mg/dL (ref >39)
LDL Chol Calc (NIH): 87 mg/dL (ref 0–99)
Triglycerides: 83 mg/dL (ref 0–149)
VLDL Cholesterol Cal: 15 mg/dL (ref 5–40)

## 2022-05-28 LAB — HEMOGLOBIN A1C
Est. average glucose Bld gHb Est-mCnc: 114 mg/dL
Hgb A1c MFr Bld: 5.6 % (ref 4.8–5.6)

## 2022-05-28 LAB — MAGNESIUM: Magnesium: 1.9 mg/dL (ref 1.6–2.3)

## 2022-06-03 DIAGNOSIS — Z419 Encounter for procedure for purposes other than remedying health state, unspecified: Secondary | ICD-10-CM | POA: Diagnosis not present

## 2022-06-19 ENCOUNTER — Encounter: Payer: Self-pay | Admitting: Pulmonary Disease

## 2022-06-19 ENCOUNTER — Ambulatory Visit (INDEPENDENT_AMBULATORY_CARE_PROVIDER_SITE_OTHER): Payer: Medicaid Other | Admitting: Pulmonary Disease

## 2022-06-19 VITALS — BP 104/76 | HR 83 | Ht 64.0 in | Wt 116.6 lb

## 2022-06-19 DIAGNOSIS — J441 Chronic obstructive pulmonary disease with (acute) exacerbation: Secondary | ICD-10-CM | POA: Diagnosis not present

## 2022-06-19 MED ORDER — BUPROPION HCL ER (SR) 150 MG PO TB12: 150.0000 mg | ORAL_TABLET | Freq: Two times a day (BID) | ORAL | 3 refills | Status: DC

## 2022-06-19 NOTE — Patient Instructions (Signed)
I will see you back in about 6 to 8 weeks  Schedule for pulmonary function test  Prescription for Wellbutrin to help you stay away from cigarettes was sent to pharmacy  Continue using your Stiolto  Use rescue inhalers as needed  Graded exercises as tolerated  Call us with significant concerns

## 2022-06-19 NOTE — Progress Notes (Unsigned)
Jasmin Berger    [id number]    March 07, 1970  Primary Care Physician:Olson, Mabeline Caras, MD  Referring Physician: Sandre Kitty, MD [address] [place],  [place] [address]  Chief complaint:   Follow-up of recent ED visit  HPI:  Was recently seen in the emergency department for hypoxemic respiratory failure, she was sent to the emergency department following being evaluated at an urgent care Has underlying obstructive lung disease  An active smoker up until recently, quit on the day of recent ED visit on April 18 Was noted to have saturations in the low 80s Initiated on oxygen supplementation  She was smoking up to 2 packs a day, usually smoked a pack a day and has smoked for about 25 years  She does have shortness of breath with activity but this is getting better Has been using oxygen regularly, does have a pulse ox and checks oxygen regularly  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   Loratadine (CLARITIN) 10 MG CAPS Take by mouth.   Multiple Vitamin (MULTIVITAMIN) tablet Take 1 tablet by mouth daily.   Tiotropium Bromide-Olodaterol (STIOLTO RESPIMAT) 2.5-2.5 MCG/ACT AERS Inhale 2 puffs into the lungs daily.   vitamin B-12 (CYANOCOBALAMIN) 100 MCG tablet Take 100 mcg by mouth daily.   [DISCONTINUED] predniSONE (DELTASONE) 20 MG tablet Take 60 mg daily x 2 days then 40 mg daily x 2 days then 20 mg daily x 2 days   No facility-administered encounter medications on file as of 06/19/2022.    Allergies as of 06/19/2022 - Review Complete 06/19/2022  Allergen Reaction Noted   Latex Rash 06/19/2022     Past Medical History:  Diagnosis Date   Mitral valve prolapse     Past Surgical History:  Procedure Laterality Date   ABDOMINAL HYSTERECTOMY     ADENOIDECTOMY     CESAREAN SECTION     TYMPANOSTOMY TUBE PLACEMENT      Family History  Problem Relation Age of Onset   Crohn's disease Mother     Social History   Socioeconomic History   Marital status: Married    Spouse name: Not on file   Number of children: Not on file   Years of education: Not on file   Highest education level: Not on file  Occupational History   Not on file  Tobacco Use   Smoking status: Former    Packs/day: 1    Types: Cigarettes   Smokeless tobacco: Never   Tobacco comments:    Pt states she quit 05/21/22  Vaping Use   Vaping Use: Never used  Substance and Sexual Activity   Alcohol use: Yes   Drug use: No   Sexual activity: Not Currently  Other Topics Concern   Not on file  Social History Narrative   Not on file   Social Determinants of Health   Financial Resource Strain: Not on file  Food Insecurity: Not on file  Transportation Needs: Not on file  Physical Activity: Not on file  Stress: Not on file  Social Connections: Not on file  Intimate Partner  Violence: Not on file    Review of Systems  Respiratory:  Positive for cough and shortness of breath.     Vitals:   06/19/22 0914  BP: 104/76  Pulse: 83  SpO2: 98%     Physical Exam Constitutional:      Appearance: Normal appearance.  HENT:     Head: Normocephalic.     Mouth/Throat:     Mouth: Mucous membranes are moist.  Cardiovascular:     Rate and Rhythm: Normal rate and regular rhythm.     Heart sounds: No murmur heard.    No friction rub.  Pulmonary:     Effort: No respiratory distress.     Breath sounds: No stridor. No wheezing or rhonchi.     Comments: Decreased air movement bilaterally Musculoskeletal:     Cervical back: No rigidity or tenderness.  Neurological:     Mental Status: She is alert.   Psychiatric:        Mood and Affect: Mood normal.    Data Reviewed: Recent chest x-ray reviewed -No acute infiltrative process, hyperinflated lung fields  Recent ED records reviewed  Assessment:  Chronic obstructive pulmonary disease  Recent smoker  Hypoxemic respiratory failure    Plan/Recommendations: She has quit smoking recently, she still does have a lot of cravings -Did not tolerate Chantix in the past -Nicotine patches did not help in the past -Wellbutrin as an option of treatment -Prescription for Wellbutrin sent to pharmacy  She does have Stiolto prescribed and she is using it -She does have a rescue albuterol to use as needed  Graded activities as tolerated was recommended  She is to continue oxygen supplementation -To try and wean oxygen as tolerated  Will schedule the patient for pulmonary function test  Patient is already scheduled for low-dose CT on 06/25/2022 -Will follow-up on this  Expectation is that she will be able to wean off oxygen sometime soon  Letter was provided for work accommodation  Follow-up will be scheduled for about 6 to 8 weeks  Virl Diamond MD  Pulmonary and Critical Care 06/19/2022, 9:35 AM  CC: Sandre Kitty, MD

## 2022-06-21 DIAGNOSIS — R0602 Shortness of breath: Secondary | ICD-10-CM | POA: Diagnosis not present

## 2022-06-21 DIAGNOSIS — R0902 Hypoxemia: Secondary | ICD-10-CM | POA: Diagnosis not present

## 2022-06-21 DIAGNOSIS — J449 Chronic obstructive pulmonary disease, unspecified: Secondary | ICD-10-CM | POA: Diagnosis not present

## 2022-06-23 ENCOUNTER — Encounter: Payer: Self-pay | Admitting: Family Medicine

## 2022-06-23 ENCOUNTER — Ambulatory Visit (INDEPENDENT_AMBULATORY_CARE_PROVIDER_SITE_OTHER): Payer: Medicaid Other | Admitting: Family Medicine

## 2022-06-23 VITALS — BP 94/60 | HR 83 | Temp 98.2°F | Ht 64.0 in | Wt 117.8 lb

## 2022-06-23 DIAGNOSIS — J449 Chronic obstructive pulmonary disease, unspecified: Secondary | ICD-10-CM | POA: Diagnosis not present

## 2022-06-23 DIAGNOSIS — M79642 Pain in left hand: Secondary | ICD-10-CM | POA: Diagnosis not present

## 2022-06-23 DIAGNOSIS — M79641 Pain in right hand: Secondary | ICD-10-CM

## 2022-06-23 DIAGNOSIS — Z23 Encounter for immunization: Secondary | ICD-10-CM | POA: Diagnosis not present

## 2022-06-23 DIAGNOSIS — Z1231 Encounter for screening mammogram for malignant neoplasm of breast: Secondary | ICD-10-CM

## 2022-06-23 DIAGNOSIS — Z1211 Encounter for screening for malignant neoplasm of colon: Secondary | ICD-10-CM

## 2022-06-23 MED ORDER — ALBUTEROL SULFATE HFA 108 (90 BASE) MCG/ACT IN AERS: 2.0000 | INHALATION_SPRAY | RESPIRATORY_TRACT | 11 refills | Status: AC | PRN

## 2022-06-23 NOTE — Patient Instructions (Signed)
It was nice to see you today,  I have ordered a colonoscopy referral, mammogram, x-rays of your hands bilaterally.  I have also refilled your albuterol.  Please schedule an appointment with [place] in the next month for a Pap smear.  We can talk further about your hand pain at that visit as well.  Have a great day,  Frederic Jericho, MD

## 2022-06-23 NOTE — Assessment & Plan Note (Signed)
Followed up with pulmonology.  Weaning off oxygen. - Upcoming CT scan lungs - Upcoming PFTs with pulmonology - Continue Stiolto and albuterol

## 2022-06-23 NOTE — Progress Notes (Signed)
   Established Patient Office Visit  Subjective   Patient ID: Jasmin Berger, female    DOB: Mar 12, 1970  Age: 52 y.o. MRN: [id number]  Chief Complaint  Patient presents with   Pulmonary Issues       Patient is trying to wean off oxygen per her pulmonologist recommendation.  Now on 1 L.  Using her Stiolto.  Needs refill on albuterol.  Has a follow-up with him on July 5.  Does not smoke still, but is with somebody often who smokes.  She states they try not to smoke in front of her but patient still has smell of tobacco on her.  Patient says that her hands have been painful and swelling in the joints of the fingers for the past 3 years.  Is difficult to write or squeeze her hands completely.  Patient is interested in getting a mammogram, colonoscopy and Pap smear.  She has a CT scan of her lungs upcoming next week.      ROS    Objective:     BP 94/60   Pulse 83   Temp 98.2 F (36.8 C) (Oral)   Ht 5\' 4"  (1.626 m)   Wt 117 lb 12 oz (53.4 kg)   SpO2 97%   BMI 20.21 kg/m    Physical Exam General: Alert, oriented CV: Regular rate and rhythm Pulmonary: Lungs clear bilaterally.  On 1 L nasal cannula.  No respiratory distress.  No results found for any visits on 06/23/22.    The 10-year ASCVD risk score (Arnett DK, et al., 2019) is: 0.5%    Assessment & Plan:   Problem List Items Addressed This Visit       Respiratory   Chronic obstructive pulmonary disease (HCC)    Followed up with pulmonology.  Weaning off oxygen. - Upcoming CT scan lungs - Upcoming PFTs with pulmonology - Continue Stiolto and albuterol      Relevant Medications   albuterol (VENTOLIN HFA) 108 (90 Base) MCG/ACT inhaler     Other   Bilateral hand pain    3 years of joint pain in the fingers with stiffness and decreased strength.  Also with swelling. - X-ray of hands bilaterally - Consider autoimmune testing at next visit in a month.      Relevant Orders   DG Hand Complete Left   DG  Hand Complete Right   Other Visit Diagnoses     Encounter for screening mammogram for malignant neoplasm of breast    -  Primary   Relevant Orders   MM 3D SCREENING MAMMOGRAM BILATERAL BREAST   Encounter for colorectal cancer screening       Relevant Orders   Ambulatory referral to Gastroenterology       Return in about 4 weeks (around 07/21/2022) for pap.    Sandre Kitty, MD

## 2022-06-23 NOTE — Assessment & Plan Note (Signed)
3 years of joint pain in the fingers with stiffness and decreased strength.  Also with swelling. - X-ray of hands bilaterally - Consider autoimmune testing at next visit in a month.

## 2022-06-25 ENCOUNTER — Ambulatory Visit
Admission: RE | Admit: 2022-06-25 | Discharge: 2022-06-25 | Disposition: A | Discharge status: Home / Self Care | Payer: Medicaid Other | Source: Ambulatory Visit | Attending: Family Medicine | Admitting: Family Medicine

## 2022-06-25 DIAGNOSIS — J449 Chronic obstructive pulmonary disease, unspecified: Secondary | ICD-10-CM

## 2022-06-25 DIAGNOSIS — M7989 Other specified soft tissue disorders: Secondary | ICD-10-CM | POA: Diagnosis not present

## 2022-06-25 DIAGNOSIS — M79642 Pain in left hand: Secondary | ICD-10-CM | POA: Diagnosis not present

## 2022-06-25 DIAGNOSIS — J439 Emphysema, unspecified: Secondary | ICD-10-CM | POA: Diagnosis not present

## 2022-06-25 DIAGNOSIS — M79641 Pain in right hand: Secondary | ICD-10-CM | POA: Diagnosis not present

## 2022-06-25 DIAGNOSIS — I700 Atherosclerosis of aorta: Secondary | ICD-10-CM | POA: Diagnosis not present

## 2022-07-04 DIAGNOSIS — Z419 Encounter for procedure for purposes other than remedying health state, unspecified: Secondary | ICD-10-CM | POA: Diagnosis not present

## 2022-07-21 ENCOUNTER — Ambulatory Visit (INDEPENDENT_AMBULATORY_CARE_PROVIDER_SITE_OTHER): Payer: Medicaid Other | Admitting: Family Medicine

## 2022-07-21 ENCOUNTER — Encounter: Payer: Self-pay | Admitting: Family Medicine

## 2022-07-21 ENCOUNTER — Other Ambulatory Visit (HOSPITAL_COMMUNITY)
Admission: RE | Admit: 2022-07-21 | Discharge: 2022-07-21 | Disposition: A | Discharge status: Home / Self Care | Payer: Medicaid Other | Source: Ambulatory Visit | Attending: Family Medicine | Admitting: Family Medicine

## 2022-07-21 VITALS — BP 100/66 | HR 74 | Temp 97.6°F | Ht 64.0 in | Wt 118.2 lb

## 2022-07-21 DIAGNOSIS — Z23 Encounter for immunization: Secondary | ICD-10-CM

## 2022-07-21 DIAGNOSIS — Z124 Encounter for screening for malignant neoplasm of cervix: Secondary | ICD-10-CM

## 2022-07-21 DIAGNOSIS — M1990 Unspecified osteoarthritis, unspecified site: Secondary | ICD-10-CM

## 2022-07-21 DIAGNOSIS — J449 Chronic obstructive pulmonary disease, unspecified: Secondary | ICD-10-CM | POA: Diagnosis not present

## 2022-07-21 NOTE — Assessment & Plan Note (Signed)
Evidence of arthritis seen on imaging.  Follow-up inflammatory arthritis testing.  Treat appropriately pending test results.

## 2022-07-21 NOTE — Patient Instructions (Signed)
It was nice to see you today,  We will let you know the results of the Pap smear and the blood testing when we get it.  We sent off a referral to [facility] gastroenterology for the colonoscopy.  You should call them to schedule the appointment.  Their phone number [phone]  I would like to see back in 2 months.  Pending your lab results we may see you back sooner.  Have a great day,  Frederic Jericho, MD

## 2022-07-21 NOTE — Assessment & Plan Note (Signed)
Now back on room air, only on oxygen at night.  Would recommend she continues wearing oxygen at night at this time. - Continue daily maintenance inhaler and albuterol - Continue follow-up pulmonary testing with pulmonology.

## 2022-07-21 NOTE — Assessment & Plan Note (Signed)
Patient uncertain if she had total or partial hysterectomy.  Cervix was difficult to find but appeared to be directed towards the 2 o'clock position.  No other abnormalities identified. Pap smear with HPV cotesting.

## 2022-07-21 NOTE — Progress Notes (Signed)
   Established Patient Office Visit  Subjective   Patient ID: Jasmin Berger, female    DOB: 09-14-1970  Age: 52 y.o. MRN: [id number]  Chief Complaint  Patient presents with   Gynecologic Exam    HPI Joint pain-patient still complaining of joint pain in her hands.  This has been worsening over several years.  Discussed her x-ray results.  Also discussed further lab testing.  Patient here for Pap smear today.  Has had hysterectomy but does not know if it was partial or complete.  Does state that she had her ovaries preserved.  Patient no longer wearing oxygen during the day.  Is only wearing it at night.  Noticed that when she does not wear it at night she has more trouble sleeping.  Patient has not heard from a GI referral regarding colonoscopy.  Stated will provide her with their number and advised her to call them regarding referral.     ROS    Objective:     BP 100/66   Pulse 74   Temp 97.6 F (36.4 C) (Oral)   Ht 5\' 4"  (1.626 m)   Wt 118 lb 4 oz (53.6 kg)   SpO2 97%   BMI 20.30 kg/m    Physical Exam General: Alert, oriented no acute distress CV: Regular rhythm Pulmonary: Lungs clear bilaterally.  On room air. GU: No vulvovaginal rashes or abnormalities.  Cervical os appears in the left upper quadrant at the 2 o'clock position.   No results found for any visits on 07/21/22.    The 10-year ASCVD risk score (Arnett DK, et al., 2019) is: 0.7%    Assessment & Plan:   Problem List Items Addressed This Visit       Respiratory   Chronic obstructive pulmonary disease (HCC)    Now back on room air, only on oxygen at night.  Would recommend she continues wearing oxygen at night at this time. - Continue daily maintenance inhaler and albuterol - Continue follow-up pulmonary testing with pulmonology.        Musculoskeletal and Integument   Arthritis - Primary    Evidence of arthritis seen on imaging.  Follow-up inflammatory arthritis testing.  Treat  appropriately pending test results.      Relevant Orders   Rheumatoid Factor   Anti-CCP Ab, IgG + IgA (RDL)   Sedimentation rate   C-reactive protein     Other   Pap smear for cervical cancer screening    Patient uncertain if she had total or partial hysterectomy.  Cervix was difficult to find but appeared to be directed towards the 2 o'clock position.  No other abnormalities identified. Pap smear with HPV cotesting.      Relevant Orders   Cytology - PAP( [place])   Other Visit Diagnoses     Need for pneumococcal 20-valent conjugate vaccination       Relevant Orders   Pneumococcal conjugate vaccine 20-valent (Prevnar 20) (Completed)       Return in about 2 months (around 09/20/2022) for copd.    Sandre Kitty, MD

## 2022-07-22 ENCOUNTER — Ambulatory Visit
Admission: RE | Admit: 2022-07-22 | Discharge: 2022-07-22 | Disposition: A | Discharge status: Home / Self Care | Payer: Medicaid Other | Source: Ambulatory Visit | Attending: Family Medicine | Admitting: Family Medicine

## 2022-07-22 DIAGNOSIS — Z1231 Encounter for screening mammogram for malignant neoplasm of breast: Secondary | ICD-10-CM

## 2022-07-22 DIAGNOSIS — R0602 Shortness of breath: Secondary | ICD-10-CM | POA: Diagnosis not present

## 2022-07-22 DIAGNOSIS — J449 Chronic obstructive pulmonary disease, unspecified: Secondary | ICD-10-CM | POA: Diagnosis not present

## 2022-07-22 DIAGNOSIS — R0902 Hypoxemia: Secondary | ICD-10-CM | POA: Diagnosis not present

## 2022-07-22 LAB — CYTOLOGY - PAP
Adequacy: ABSENT
Chlamydia: NEGATIVE
Comment: NEGATIVE
Comment: NEGATIVE
Comment: NEGATIVE
Comment: NORMAL
Diagnosis: NEGATIVE
High risk HPV: POSITIVE — AB
Neisseria Gonorrhea: NEGATIVE
Trichomonas: NEGATIVE

## 2022-07-27 ENCOUNTER — Encounter: Payer: Self-pay | Admitting: Gastroenterology

## 2022-07-27 ENCOUNTER — Other Ambulatory Visit: Payer: Self-pay | Admitting: Family Medicine

## 2022-07-27 DIAGNOSIS — R928 Other abnormal and inconclusive findings on diagnostic imaging of breast: Secondary | ICD-10-CM

## 2022-07-29 LAB — C-REACTIVE PROTEIN: CRP: 3 mg/L (ref 0–10)

## 2022-07-29 LAB — SEDIMENTATION RATE: Sed Rate: 2 mm/hr (ref 0–40)

## 2022-07-29 LAB — ANTI-CCP AB, IGG + IGA (RDL): Anti-CCP Ab, IgG + IgA (RDL): <20 Units (ref <20)

## 2022-07-29 LAB — RHEUMATOID FACTOR: Rheumatoid fact SerPl-aCnc: <10 IU/mL (ref <14.0)

## 2022-07-31 ENCOUNTER — Other Ambulatory Visit: Payer: Self-pay | Admitting: Family Medicine

## 2022-07-31 ENCOUNTER — Ambulatory Visit
Admission: RE | Admit: 2022-07-31 | Discharge: 2022-07-31 | Disposition: A | Discharge status: Home / Self Care | Payer: Medicaid Other | Source: Ambulatory Visit | Attending: Family Medicine | Admitting: Family Medicine

## 2022-07-31 DIAGNOSIS — Z803 Family history of malignant neoplasm of breast: Secondary | ICD-10-CM | POA: Diagnosis not present

## 2022-07-31 DIAGNOSIS — N6310 Unspecified lump in the right breast, unspecified quadrant: Secondary | ICD-10-CM

## 2022-07-31 DIAGNOSIS — N63 Unspecified lump in unspecified breast: Secondary | ICD-10-CM | POA: Diagnosis not present

## 2022-07-31 DIAGNOSIS — R928 Other abnormal and inconclusive findings on diagnostic imaging of breast: Secondary | ICD-10-CM

## 2022-07-31 DIAGNOSIS — R922 Inconclusive mammogram: Secondary | ICD-10-CM | POA: Diagnosis not present

## 2022-08-03 ENCOUNTER — Ambulatory Visit
Admission: RE | Admit: 2022-08-03 | Discharge: 2022-08-03 | Disposition: A | Discharge status: Home / Self Care | Payer: Medicaid Other | Source: Ambulatory Visit | Attending: Family Medicine | Admitting: Family Medicine

## 2022-08-03 DIAGNOSIS — D241 Benign neoplasm of right breast: Secondary | ICD-10-CM | POA: Diagnosis not present

## 2022-08-03 DIAGNOSIS — N6310 Unspecified lump in the right breast, unspecified quadrant: Secondary | ICD-10-CM

## 2022-08-03 DIAGNOSIS — Z419 Encounter for procedure for purposes other than remedying health state, unspecified: Secondary | ICD-10-CM | POA: Diagnosis not present

## 2022-08-03 DIAGNOSIS — N6315 Unspecified lump in the right breast, overlapping quadrants: Secondary | ICD-10-CM | POA: Diagnosis not present

## 2022-08-03 HISTORY — PX: BREAST BIOPSY: SHX20

## 2022-08-07 ENCOUNTER — Ambulatory Visit (AMBULATORY_SURGERY_CENTER): Payer: Medicaid Other | Admitting: *Deleted

## 2022-08-07 VITALS — Ht 64.0 in | Wt 118.0 lb

## 2022-08-07 DIAGNOSIS — Z1211 Encounter for screening for malignant neoplasm of colon: Secondary | ICD-10-CM

## 2022-08-07 MED ORDER — NA SULFATE-K SULFATE-MG SULF 17.5-3.13-1.6 GM/177ML PO SOLN: 1.0000 | Freq: Once | ORAL | 0 refills | Status: AC

## 2022-08-07 NOTE — Progress Notes (Signed)
Pt's name and DOB verified at the beginning of the pre-visit.  Pt denies any difficulty with ambulating,sitting, laying down or rolling side to side Gave both [facility] main # and MD on call # prior to instructions.  No egg or soy allergy known to patient  No issues known to pt with past sedation with any surgeries or procedures Pt denies having issues being intubated Patient denies ever being intubated Pt has no issues moving head neck or swallowing No FH of Malignant Hyperthermia Pt is not on diet pills Pt is not on home 02  Pt is not on blood thinners  Pt has frequent issues with constipation RN instructed pt to use Miralax per bottles instructions a week before prep days. Pt states they will Pt is not on dialysis Pt denise any abnormal heart rhythms  Pt denies any upcoming cardiac testing Pt encouraged to use to use Singlecare or Goodrx to reduce cost  Patient's chart reviewed by Cathlyn Parsons CNRA prior to pre-visit and patient appropriate for the [facility].  Pre-visit completed and red dot placed by patient's name on their procedure day (on provider's schedule).  . Visit by phone Pt states weight is 118lb Instructed pt why it is important to and  to call if they have any changes in health or new medications. Directed them to the # given and on instructions.   Pt states they will.  Instructions reviewed with pt and pt states understanding. Instructed to review again prior to procedure. Pt states they will.  Instructions sent by mail and by my chart

## 2022-08-21 DIAGNOSIS — R0602 Shortness of breath: Secondary | ICD-10-CM | POA: Diagnosis not present

## 2022-08-21 DIAGNOSIS — J449 Chronic obstructive pulmonary disease, unspecified: Secondary | ICD-10-CM | POA: Diagnosis not present

## 2022-08-21 DIAGNOSIS — R0902 Hypoxemia: Secondary | ICD-10-CM | POA: Diagnosis not present

## 2022-09-03 DIAGNOSIS — Z419 Encounter for procedure for purposes other than remedying health state, unspecified: Secondary | ICD-10-CM | POA: Diagnosis not present

## 2022-09-09 ENCOUNTER — Ambulatory Visit (INDEPENDENT_AMBULATORY_CARE_PROVIDER_SITE_OTHER): Payer: Medicaid Other | Admitting: Pulmonary Disease

## 2022-09-09 ENCOUNTER — Encounter: Payer: Medicaid Other | Admitting: Gastroenterology

## 2022-09-09 DIAGNOSIS — J441 Chronic obstructive pulmonary disease with (acute) exacerbation: Secondary | ICD-10-CM

## 2022-09-09 LAB — PULMONARY FUNCTION TEST
DL/VA % pred: 47 %
DL/VA: 2.01 ml/min/mmHg/L
DLCO cor % pred: 44 %
DLCO cor: 9.26 ml/min/mmHg
DLCO unc % pred: 44 %
DLCO unc: 9.26 ml/min/mmHg
FEF 25-75 Post: 0.47 L/sec
FEF 25-75 Pre: 0.67 L/sec
FEF2575-%Change-Post: -29 %
FEF2575-%Pred-Post: 18 %
FEF2575-%Pred-Pre: 25 %
FEV1-%Change-Post: -10 %
FEV1-%Pred-Post: 49 %
FEV1-%Pred-Pre: 55 %
FEV1-Post: 1.35 L
FEV1-Pre: 1.51 L
FEV1FVC-%Change-Post: -4 %
FEV1FVC-%Pred-Pre: 72 %
FEV6-%Change-Post: -8 %
FEV6-%Pred-Post: 70 %
FEV6-%Pred-Pre: 76 %
FEV6-Post: 2.36 L
FEV6-Pre: 2.57 L
FEV6FVC-%Change-Post: -1 %
FEV6FVC-%Pred-Post: 99 %
FEV6FVC-%Pred-Pre: 101 %
FVC-%Change-Post: -6 %
FVC-%Pred-Post: 70 %
FVC-%Pred-Pre: 75 %
FVC-Post: 2.44 L
FVC-Pre: 2.61 L
Post FEV1/FVC ratio: 55 %
Post FEV6/FVC ratio: 97 %
Pre FEV1/FVC ratio: 58 %
Pre FEV6/FVC Ratio: 98 %
RV % pred: 189 %
RV: 3.53 L
TLC % pred: 119 %
TLC: 6.02 L

## 2022-09-09 NOTE — Patient Instructions (Signed)
Full PFT performed today. °

## 2022-09-09 NOTE — Progress Notes (Signed)
Full PFT performed today. °

## 2022-09-15 ENCOUNTER — Encounter: Payer: Self-pay | Admitting: Gastroenterology

## 2022-09-15 ENCOUNTER — Ambulatory Visit: Payer: Medicaid Other | Admitting: Gastroenterology

## 2022-09-15 VITALS — BP 108/60 | HR 65 | Temp 96.6°F | Resp 18 | Ht 64.0 in | Wt 118.0 lb

## 2022-09-15 DIAGNOSIS — D125 Benign neoplasm of sigmoid colon: Secondary | ICD-10-CM | POA: Diagnosis not present

## 2022-09-15 DIAGNOSIS — K529 Noninfective gastroenteritis and colitis, unspecified: Secondary | ICD-10-CM | POA: Diagnosis not present

## 2022-09-15 DIAGNOSIS — J449 Chronic obstructive pulmonary disease, unspecified: Secondary | ICD-10-CM | POA: Diagnosis not present

## 2022-09-15 DIAGNOSIS — Z1211 Encounter for screening for malignant neoplasm of colon: Secondary | ICD-10-CM

## 2022-09-15 DIAGNOSIS — D124 Benign neoplasm of descending colon: Secondary | ICD-10-CM | POA: Diagnosis not present

## 2022-09-15 MED ORDER — SODIUM CHLORIDE 0.9 % IV SOLN
500.0000 mL | Freq: Once | INTRAVENOUS | Status: DC
Start: 2022-09-15 — End: 2022-09-15

## 2022-09-15 NOTE — Progress Notes (Signed)
[facility] History and Physical   Primary Care Physician:  Sandre Kitty, MD   Reason for Procedure:    [facility] screening/ family history of Crohn's disease- daughter and mother  Plan:    colon     HPI: Jasmin Berger is a 52 y.o. female    Past Medical History:  Diagnosis Date   Arthritis    COPD (chronic obstructive pulmonary disease) (HCC)    TESTing in 8/24 to verify   Emphysema of lung (HCC)    Testing 8/24 to verify   Heart murmur    Mitral valve prolapse    Oxygen deficiency    Uses at night  1 liter    Past Surgical History:  Procedure Laterality Date   ABDOMINAL HYSTERECTOMY     ADENOIDECTOMY     BREAST BIOPSY Right 08/03/2022   [place] RT BREAST BX W LOC DEV 1ST LESION IMG BX SPEC US GUIDE 08/03/2022 [facility]   CESAREAN SECTION     TYMPANOSTOMY TUBE PLACEMENT      Prior to Admission medications   Medication Sig Start Date End Date Taking? Authorizing Provider  albuterol (VENTOLIN HFA) 108 (90 Base) MCG/ACT inhaler Inhale 2 puffs into the lungs every 4 (four) hours as needed for wheezing or shortness of breath. 06/23/22  Yes Sandre Kitty, MD  buPROPion [facility] SR) 150 MG 12 hr tablet Take 1 tablet (150 mg total) by mouth 2 (two) times daily. 06/19/22  Yes Olalere, Adewale A, MD  Loratadine (CLARITIN) 10 MG CAPS Take by mouth.   Yes [provider]  Multiple Vitamin (MULTIVITAMIN) tablet Take 1 tablet by mouth daily.   Yes [provider]  Tiotropium Bromide-Olodaterol (STIOLTO RESPIMAT) 2.5-2.5 MCG/ACT AERS Inhale 2 puffs into the lungs daily. 05/27/22  Yes Sandre Kitty, MD  vitamin B-12 (CYANOCOBALAMIN) 100 MCG tablet Take 100 mcg by mouth daily.   Yes [provider]  vitamin E 180 MG (400 UNITS) capsule Take 400 Units by mouth daily.   Yes [provider]  polyethylene glycol powder (GLYCOLAX/MIRALAX) 17 GM/SCOOP powder Take 1 Container by mouth once.    [provider]    Current  Outpatient Medications  Medication Sig Dispense Refill   albuterol (VENTOLIN HFA) 108 (90 Base) MCG/ACT inhaler Inhale 2 puffs into the lungs every 4 (four) hours as needed for wheezing or shortness of breath. 1 each 11   buPROPion (WELLBUTRIN SR) 150 MG 12 hr tablet Take 1 tablet (150 mg total) by mouth 2 (two) times daily. 60 tablet 3   Loratadine (CLARITIN) 10 MG CAPS Take by mouth.     Multiple Vitamin (MULTIVITAMIN) tablet Take 1 tablet by mouth daily.     Tiotropium Bromide-Olodaterol (STIOLTO RESPIMAT) 2.5-2.5 MCG/ACT AERS Inhale 2 puffs into the lungs daily. 4 g 5   vitamin B-12 (CYANOCOBALAMIN) 100 MCG tablet Take 100 mcg by mouth daily.     vitamin E 180 MG (400 UNITS) capsule Take 400 Units by mouth daily.     polyethylene glycol powder (GLYCOLAX/MIRALAX) 17 GM/SCOOP powder Take 1 Container by mouth once.     No current facility-administered medications for this visit.    Allergies as of 09/15/2022 - Review Complete 09/15/2022  Allergen Reaction Noted   Latex Rash 06/19/2022    Family History  Problem Relation Age of Onset   Colon polyps Mother    Crohn's disease Mother    Breast cancer Sister    Colon polyps Maternal Aunt  Crohn's disease Daughter    Colon cancer Neg Hx    Esophageal cancer Neg Hx    Rectal cancer Neg Hx    Stomach cancer Neg Hx     Social History   Socioeconomic History   Marital status: Single    Spouse name: Not on file   Number of children: Not on file   Years of education: Not on file   Highest education level: Not on file  Occupational History   Not on file  Tobacco Use   Smoking status: Former    Current packs/day: 1.00    Types: Cigarettes   Smokeless tobacco: Never   Tobacco comments:    Pt states she quit 05/21/22  Vaping Use   Vaping status: Never Used  Substance and Sexual Activity   Alcohol use: Yes   Drug use: No   Sexual activity: Not Currently    Birth control/protection: Surgical  Other Topics Concern   Not on  file  Social History Narrative   Not on file   Social Determinants of Health   Financial Resource Strain: Not on file  Food Insecurity: Not on file  Transportation Needs: Not on file  Physical Activity: Not on file  Stress: Not on file  Social Connections: Not on file  Intimate Partner Violence: Not on file    Review of Systems: Positive for none All other review of systems negative except as mentioned in the HPI.  Physical Exam: Vital signs in last 24 hours: @VSRANGES @   General:   Alert,  Well-developed, well-nourished, pleasant and cooperative in NAD Lungs:  Clear throughout to auscultation.   Heart:  Regular rate and rhythm; no murmurs, clicks, rubs,  or gallops. Abdomen:  Soft, nontender and nondistended. Normal bowel sounds.   Neuro/Psych:  Alert and cooperative. Normal mood and affect. A and O x 3    No significant changes were identified.  The patient continues to be an appropriate candidate for the planned procedure and anesthesia.   Edman Circle, MD. [facility] 09/15/2022 11:42 AM@

## 2022-09-15 NOTE — Progress Notes (Signed)
Called to room to assist during endoscopic procedure.  Patient ID and intended procedure confirmed with present staff. Received instructions for my participation in the procedure from the performing physician.  

## 2022-09-15 NOTE — Patient Instructions (Addendum)
Handout on polyps given. Resume previous diet and continue present medications. Follow up appointment scheduled.    YOU HAD AN ENDOSCOPIC PROCEDURE TODAY AT THE [facility]:   Refer to the procedure report that was given to you for any specific questions about what was found during the examination.  If the procedure report does not answer your questions, please call your gastroenterologist to clarify.  If you requested that your care partner not be given the details of your procedure findings, then the procedure report has been included in a sealed envelope for you to review at your convenience later.  YOU SHOULD EXPECT: Some feelings of bloating in the abdomen. Passage of more gas than usual.  Walking can help get rid of the air that was put into your GI tract during the procedure and reduce the bloating. If you had a lower endoscopy (such as a colonoscopy or flexible sigmoidoscopy) you may notice spotting of blood in your stool or on the toilet paper. If you underwent a bowel prep for your procedure, you may not have a normal bowel movement for a few days.  Please Note:  You might notice some irritation and congestion in your nose or some drainage.  This is from the oxygen used during your procedure.  There is no need for concern and it should clear up in a day or so.  SYMPTOMS TO REPORT IMMEDIATELY:  Following lower endoscopy (colonoscopy or flexible sigmoidoscopy):  Excessive amounts of blood in the stool  Significant tenderness or worsening of abdominal pains  Swelling of the abdomen that is new, acute  Fever of 100F or higher   For urgent or emergent issues, a gastroenterologist can be reached at any hour by calling [phone]. Do not use MyChart messaging for urgent concerns.    DIET:  We do recommend a small meal at first, but then you may proceed to your regular diet.  Drink plenty of fluids but you should avoid alcoholic beverages for 24 hours.  ACTIVITY:  You  should plan to take it easy for the rest of today and you should NOT DRIVE or use heavy machinery until tomorrow (because of the sedation medicines used during the test).    FOLLOW UP: Our staff will call the number listed on your records the next business day following your procedure.  We will call around 7:15- 8:00 am to check on you and address any questions or concerns that you may have regarding the information given to you following your procedure. If we do not reach you, we will leave a message.     If any biopsies were taken you will be contacted by phone or by letter within the next 1-3 weeks.  Please call us at [phone] if you have not heard about the biopsies in 3 weeks.    SIGNATURES/CONFIDENTIALITY: You and/or your care partner have signed paperwork which will be entered into your electronic medical record.  These signatures attest to the fact that that the information above on your After Visit Summary has been reviewed and is understood.  Full responsibility of the confidentiality of this discharge information lies with you and/or your care-partner.

## 2022-09-15 NOTE — Progress Notes (Signed)
Report to PACU, RN, vss, BBS= Clear.  

## 2022-09-15 NOTE — Progress Notes (Signed)
VS completed by SM.  Pt's states no medical or surgical changes since previsit or office visit.  

## 2022-09-15 NOTE — Progress Notes (Signed)
.  rg

## 2022-09-15 NOTE — Op Note (Signed)
[facility] Patient Name: Jasmin Berger Procedure Date: 09/15/2022 8:42 AM MRN: [id number] Endoscopist: Lynann Bologna , MD, [id number] Age: 52 Referring MD:  Date of Birth: 06/18/1970 Gender: Female Account #: [id number] Procedure:                Colonoscopy Indications:              Screening for colorectal malignant neoplasm. Strong                            family history of Crohn's disease-daughter and                            mother. Medicines:                Monitored Anesthesia Care Procedure:                Pre-Anesthesia Assessment:                           - Prior to the procedure, a History and Physical                            was performed, and patient medications and                            allergies were reviewed. The patient's tolerance of                            previous anesthesia was also reviewed. The risks                            and benefits of the procedure and the sedation                            options and risks were discussed with the patient.                            All questions were answered, and informed consent                            was obtained. Prior Anticoagulants: The patient has                            taken no anticoagulant or antiplatelet agents. ASA                            Grade Assessment: III - A patient with severe                            systemic disease. After reviewing the risks and                            benefits, the patient was deemed in satisfactory  condition to undergo the procedure. Patient had                            Suprep.                           After obtaining informed consent, the colonoscope                            was passed under direct vision. Throughout the                            procedure, the patient's blood pressure, pulse, and                            oxygen saturations were monitored continuously. The                             Olympus Scope SN: [id number] was introduced through                            the anus and advanced to the 4 cm into the ileum.                            The colonoscopy was performed without difficulty.                            The patient tolerated the procedure well. The                            quality of the bowel preparation was adequate to                            identify polyps. The terminal ileum, ileocecal                            valve, appendiceal orifice, and rectum were                            photographed. Scope In: 8:54:58 AM Scope Out: 9:20:00 AM Scope Withdrawal Time: 0 hours 21 minutes 8 seconds  Total Procedure Duration: 0 hours 25 minutes 2 seconds  Findings:                 An area of moderately congested mucosa with                            aphthous erosions was found in the proximal                            ascending colon and in the cecum. The ileocecal                            valve was more prominent. Biopsies were taken with  a cold forceps for histology. The colonic mucosa in                            the remaining ascending colon, transverse colon had                            smudged appearance without erosions or ulcers.                            Multiple biopsies were obtained and sent for                            histology.                           Scattered (likely) several pseudopolyps were found                            in the proximal sigmoid colon, in the mid sigmoid                            colon and in the descending colon. 7-8 polyps were                            removed with a cold snare. Resection and retrieval                            were complete.                           Non-bleeding internal hemorrhoids were found during                            retroflexion. The hemorrhoids were small and Grade                            I (internal hemorrhoids that do not prolapse).                            The terminal ileum appeared normal. Biopsies were                            taken with a cold forceps for histology.                           The exam was otherwise without abnormality on                            direct and retroflexion views. Complications:            No immediate complications. Estimated Blood Loss:     Estimated blood loss: none. Impression:               - Cecal/proximal ascending colitis with prominent  ileocecal valve (biopsied)                           - Smudged vascular pattern in distal ascending                            colon and transverse colon without erosions or                            ulcers (biopsied)                           - Pseudopolyps in the proximal sigmoid colon, in                            the mid sigmoid colon and in the descending colon.                            Resected and retrieved.                           - Non-bleeding internal hemorrhoids.                           - The examined portion of the ileum was normal.                            Biopsied.                           - The examination was otherwise normal on direct                            and retroflexion views. Recommendation:           - Patient has a contact number available for                            emergencies. The signs and symptoms of potential                            delayed complications were discussed with the                            patient. Return to normal activities tomorrow.                            Written discharge instructions were provided to the                            patient.                           -The above findings are highly suggestive of                            Crohn's disease (r/o  other causes especially                            related to prep)                           - Resume previous diet.                           - Continue present medications.                            - Await pathology results.                           - Repeat colonoscopy for surveillance based on                            pathology results.                           - FU in GI clinic in 12 to 18 weeks-first available                           - The findings and recommendations were discussed                            with the patient's family. Lynann Bologna, MD 09/15/2022 9:29:41 AM This report has been signed electronically.

## 2022-09-16 ENCOUNTER — Telehealth: Payer: Self-pay

## 2022-09-16 NOTE — Telephone Encounter (Signed)
  Follow up Call-     09/15/2022    7:49 AM  Call back number  Post procedure Call Back phone  # [phone]  Permission to leave phone message Yes     Patient questions:  Do you have a fever, pain , or abdominal swelling? No. Pain Score  0 *  Have you tolerated food without any problems? Yes.    Have you been able to return to your normal activities? Yes.    Do you have any questions about your discharge instructions: Diet   No. Medications  No. Follow up visit  No.  Do you have questions or concerns about your Care? No.  Actions: * If pain score is 4 or above: No action needed, pain <4.

## 2022-09-21 ENCOUNTER — Encounter: Payer: Self-pay | Admitting: Family Medicine

## 2022-09-21 ENCOUNTER — Ambulatory Visit: Payer: Medicaid Other | Admitting: Family Medicine

## 2022-09-21 VITALS — BP 111/70 | HR 74 | Ht 64.0 in | Wt 131.1 lb

## 2022-09-21 DIAGNOSIS — J449 Chronic obstructive pulmonary disease, unspecified: Secondary | ICD-10-CM | POA: Diagnosis not present

## 2022-09-21 DIAGNOSIS — M79642 Pain in left hand: Secondary | ICD-10-CM | POA: Diagnosis not present

## 2022-09-21 DIAGNOSIS — R0602 Shortness of breath: Secondary | ICD-10-CM | POA: Diagnosis not present

## 2022-09-21 DIAGNOSIS — R011 Cardiac murmur, unspecified: Secondary | ICD-10-CM

## 2022-09-21 DIAGNOSIS — Z23 Encounter for immunization: Secondary | ICD-10-CM | POA: Diagnosis not present

## 2022-09-21 DIAGNOSIS — K529 Noninfective gastroenteritis and colitis, unspecified: Secondary | ICD-10-CM | POA: Diagnosis not present

## 2022-09-21 DIAGNOSIS — M79641 Pain in right hand: Secondary | ICD-10-CM | POA: Diagnosis not present

## 2022-09-21 DIAGNOSIS — R0902 Hypoxemia: Secondary | ICD-10-CM | POA: Diagnosis not present

## 2022-09-21 NOTE — Progress Notes (Unsigned)
   Established Patient Office Visit  Subjective   Patient ID: Jasmin Berger, female    DOB: [birth year]  Age: 52 y.o. MRN: [id number]  Chief Complaint  Patient presents with   Medical Management of Chronic Issues    HPI  Copd -patient has follow-up with the pulmonologist next week.  Is taking her inhaler and also taking the Wellbutrin.  Feels like the Wellbutrin is helping with her cravings for cigarettes.  We discussed the patient's breast biopsy findings, discussed her Pap smear results, discussed the colonoscopy results.  Patient states that she was told that she has Crohn's by the doctor after her colonoscopy.  Her mother and daughter also have Crohn's disease.  She does not currently have any diarrhea or abdominal pain.  Had blood in her stools in the distant past but not currently.  Carpal tunnel-patient states her hands go to sleep at night.  She also complains of this occurring if she uses them repetitively.  Discussed over-the-counter wrist splinting at night.   The 10-year ASCVD risk score (Arnett DK, et al., 2019) is: 0.8%  Health Maintenance Due  Topic Date Due   HIV Screening  Never done   Hepatitis C Screening  Never done   COVID-19 Vaccine (1 - 2023-24 season) Never done   Zoster Vaccines- Shingrix (2 of 2) 08/18/2022   INFLUENZA VACCINE  09/03/2022      Objective:     BP 111/70   Pulse 74   Ht 5\' 4"  (1.626 m)   Wt 131 lb 1.9 oz (59.5 kg)   SpO2 96%   BMI 22.51 kg/m  {Vitals History (Optional):23777}  Physical Exam General: Alert, oriented CV: Regular rate and rhythm Pulmonary: Lungs clear bilaterally.  Some coughing.  No wheezes or crackles. Psych: Pleasant affect   No results found for any visits on 09/21/22.      Assessment & Plan:   Colitis Assessment & Plan: Family history of Crohn's with mother and daughter.  Patient largely asymptomatic.  Recent colonoscopy showed ileitis and colitis.  Patient to follow-up with gastroenterology in 4  months.   Inflammatory bowel disease -     VITAMIN D 25 Hydroxy (Vit-D Deficiency, Fractures) -     B12 and Folate Panel  Murmur -     ECHOCARDIOGRAM COMPLETE; Future  Chronic obstructive pulmonary disease, unspecified COPD type (HCC) Assessment & Plan: As pulmonology follow-up next week.  Continue inhaler.  Off oxygen.  Discussed RSV vaccine with patient, she appears to be below the age for FDA approval   Bilateral hand pain Assessment & Plan: Discussed her negative inflammatory arthritis labs.  Discussed using nocturnal wrist splinting for carpal tunnel.  Also discussed alternative treatments if this is not helpful such as injection or surgical release.      Return in about 6 months (around 03/24/2023) for COPD.    Sandre Kitty, MD

## 2022-09-21 NOTE — Assessment & Plan Note (Signed)
Discussed her negative inflammatory arthritis labs.  Discussed using nocturnal wrist splinting for carpal tunnel.  Also discussed alternative treatments if this is not helpful such as injection or surgical release.

## 2022-09-21 NOTE — Patient Instructions (Addendum)
It was nice to see you today,  We addressed the following topics today: -I have ordered some blood test to check for vitamin deficiencies given your findings on colonoscopy - I have ordered a echocardiogram to evaluate for your murmur - You can wear wrist splints at night to help with carpal tunnel.  These will have part that will keep your wrist in place.  Wear these on both wrists at night to keep them in a neutral position - Follow-up with me in 6 months  Have a great day,  Frederic Jericho, MD

## 2022-09-21 NOTE — Assessment & Plan Note (Signed)
As pulmonology follow-up next week.  Continue inhaler.  Off oxygen.  Discussed RSV vaccine with patient, she appears to be below the age for FDA approval

## 2022-09-21 NOTE — Assessment & Plan Note (Signed)
Family history of Crohn's with mother and daughter.  Patient largely asymptomatic.  Recent colonoscopy showed ileitis and colitis.  Patient to follow-up with gastroenterology in 4 months.

## 2022-09-22 LAB — B12 AND FOLATE PANEL
Folate: >20 ng/mL (ref >3.0)
Vitamin B-12: 1566 pg/mL — ABNORMAL HIGH (ref 232–1245)

## 2022-09-22 LAB — VITAMIN D 25 HYDROXY (VIT D DEFICIENCY, FRACTURES): Vit D, 25-Hydroxy: 36.1 ng/mL (ref 30.0–100.0)

## 2022-09-23 ENCOUNTER — Ambulatory Visit (INDEPENDENT_AMBULATORY_CARE_PROVIDER_SITE_OTHER): Payer: Medicaid Other | Admitting: Pulmonary Disease

## 2022-09-23 ENCOUNTER — Encounter: Payer: Self-pay | Admitting: Pulmonary Disease

## 2022-09-23 VITALS — BP 106/74 | HR 71 | Temp 97.1°F | Ht 64.0 in | Wt 130.0 lb

## 2022-09-23 DIAGNOSIS — J441 Chronic obstructive pulmonary disease with (acute) exacerbation: Secondary | ICD-10-CM | POA: Diagnosis not present

## 2022-09-23 NOTE — Patient Instructions (Signed)
I will see you back in about 6 months  Continue to stay active  Call us with significant concerns  Continue inhalers

## 2022-09-23 NOTE — Progress Notes (Signed)
Jasmin Berger    [id number]    01-12-1971  Primary Care Physician:Olson, Mabeline Caras, MD  Referring Physician: Sandre Kitty, MD [address] [place],  [place] [address]  Chief complaint:   Follow-up for obstructive lung disease  HPI:  She has been doing relatively well  Quit smoking recently  Recent CT scan significant for emphysema Pulmonary function test shows moderate obstructive disease with severe reduction in diffusing capacity  Was smoking about 2 packs a day for about 25 years  She still does use oxygen at night  Activity level has been stable Occasional wheezing  Usually able to get chores done  she does housecleaning Has worked as a IT consultant, an Airline pilot in the past  Outpatient Encounter Medications as of 09/23/2022  Medication Sig   albuterol (VENTOLIN HFA) 108 (90 Base) MCG/ACT inhaler Inhale 2 puffs into the lungs every 4 (four) hours as needed for wheezing or shortness of breath.   buPROPion (WELLBUTRIN SR) 150 MG 12 hr tablet Take 1 tablet (150 mg total) by mouth 2 (two) times daily.   Loratadine (CLARITIN) 10 MG CAPS Take by mouth.   Multiple Vitamin (MULTIVITAMIN) tablet Take 1 tablet by mouth daily.   polyethylene glycol powder (GLYCOLAX/MIRALAX) 17 GM/SCOOP powder Take 1 Container by mouth once.   Tiotropium Bromide-Olodaterol (STIOLTO RESPIMAT) 2.5-2.5 MCG/ACT AERS Inhale 2 puffs into the lungs daily.   vitamin B-12 (CYANOCOBALAMIN) 100 MCG tablet Take 100 mcg by mouth daily.   vitamin E 180 MG (400 UNITS) capsule Take 400 Units by mouth daily.   No facility-administered encounter medications on file as of 09/23/2022.    Allergies as of 09/23/2022 - Review Complete 09/23/2022  Allergen Reaction Noted   Latex Rash 06/19/2022    Past Medical History:  Diagnosis Date   Arthritis    COPD (chronic obstructive pulmonary disease) (HCC)    TESTing in 8/24 to verify   Emphysema of lung (HCC)    Testing 8/24 to verify   Heart  murmur    Mitral valve prolapse    Oxygen deficiency    Uses at night  1 liter    Past Surgical History:  Procedure Laterality Date   ABDOMINAL HYSTERECTOMY     ADENOIDECTOMY     BREAST BIOPSY Right 08/03/2022   [place] RT BREAST BX W LOC DEV 1ST LESION IMG BX SPEC US GUIDE 08/03/2022 [facility]   CESAREAN SECTION     TYMPANOSTOMY TUBE PLACEMENT      Family History  Problem Relation Age of Onset   Colon polyps Mother    Crohn's disease Mother    Breast cancer Sister    Colon polyps Maternal Aunt    Crohn's disease Daughter    Colon cancer Neg Hx    Esophageal cancer Neg Hx    Rectal cancer Neg Hx    Stomach cancer Neg Hx     Social History   Socioeconomic History   Marital status: Single    Spouse name: Not on file   Number of children: Not on file   Years of education: Not on file   Highest education level: Not on file  Occupational History   Not on file  Tobacco Use   Smoking status: Former    Current packs/day: 1.00    Types: Cigarettes   Smokeless tobacco: Never   Tobacco comments:    Pt states she quit 05/21/22  Vaping Use   Vaping status: Never Used  Substance and Sexual Activity   Alcohol use: Yes   Drug use: No   Sexual activity: Not Currently    Birth control/protection: Surgical  Other Topics Concern   Not on file  Social History Narrative   Not on file   Social Determinants of Health   Financial Resource Strain: Not on file  Food Insecurity: Not on file  Transportation Needs: Not on file  Physical Activity: Not on file  Stress: Not on file  Social Connections: Not on file  Intimate Partner Violence: Not on file    Review of Systems  Constitutional:  Negative for fatigue.  Respiratory:  Positive for cough and shortness of breath.     Vitals:   09/23/22 0933  BP: 106/74  Pulse: 71  Temp: (!) 97.1 F (36.2 C)  SpO2: 95%     Physical Exam Constitutional:      Appearance: Normal appearance.  HENT:     Head: Normocephalic.      Mouth/Throat:     Mouth: Mucous membranes are moist.  Eyes:     General: No scleral icterus. Cardiovascular:     Rate and Rhythm: Normal rate and regular rhythm.     Heart sounds: No murmur heard.    No friction rub.  Pulmonary:     Effort: No respiratory distress.     Breath sounds: No stridor. No wheezing or rhonchi.     Comments: Decreased air movement bilaterally Musculoskeletal:     Cervical back: No rigidity or tenderness.  Neurological:     Mental Status: She is alert.  Psychiatric:        Mood and Affect: Mood normal.    Data Reviewed: Recent chest x-ray reviewed -No acute infiltrative process, hyperinflated lung fields  CT scan reviewed with the patient showing evidence of emphysema  Pulmonary function test reviewed with the patient showing moderate obstructive disease with severe reduction in diffusing capacity  Assessment:  Moderate chronic obstructive pulmonary disease  Recently quit smoking  Emphysema  History of hypoxemic respiratory failure  Has been stable recently  Plan/Recommendations:  Continue to use Stiolto  Rescue inhaler use as needed  Graded activities as tolerated  Oxygen supplementation at night  Encouraged to call with significant concerns  Follow-up in 6 months    Virl Diamond MD [facility] 09/23/2022, 9:41 AM  CC: Sandre Kitty, MD

## 2022-09-24 ENCOUNTER — Telehealth: Payer: Self-pay | Admitting: *Deleted

## 2022-09-24 NOTE — Addendum Note (Signed)
Addended by: Lamonte Sakai, Jennette Leask D on: 09/24/2022 11:14 AM   Modules accepted: Orders

## 2022-09-24 NOTE — Telephone Encounter (Signed)
Unable to get PA for Echo due to no documentation to support it currently.  Please determine next steps and let patient know what will be done.

## 2022-09-25 ENCOUNTER — Other Ambulatory Visit: Payer: Self-pay | Admitting: Family Medicine

## 2022-09-25 DIAGNOSIS — I700 Atherosclerosis of aorta: Secondary | ICD-10-CM

## 2022-09-25 NOTE — Telephone Encounter (Signed)
Pt informed of below and appt scheduled.

## 2022-09-25 NOTE — Telephone Encounter (Signed)
Please let pt know it has been declined.  She can come in and get a blood test called a BNP and if it's elevated she would qualify for the echo.

## 2022-09-29 ENCOUNTER — Other Ambulatory Visit: Payer: Medicaid Other

## 2022-09-29 DIAGNOSIS — I700 Atherosclerosis of aorta: Secondary | ICD-10-CM

## 2022-09-30 LAB — BRAIN NATRIURETIC PEPTIDE: BNP: 43 pg/mL (ref 0.0–100.0)

## 2022-09-30 NOTE — Progress Notes (Signed)
I have discussed biopsy results in detail with the patient. She likely has mild Crohn's disease. Not having any GI symptoms. In fact she is more constipated and has been taking MiraLAX on as needed basis. Most recently she had normal CBC, CMP, CRP, ESR. She does not want to try mesalamine or any medications for Crohn's disease. Her daughter and mom both have Crohn's disease. She is willing to wait and see how she does over the next few years. We will discuss more at follow-up visit Send report to family physician  Plan: -Repeat colonoscopy in 5 years with MiraLAX prep -FU with me in 24 to 48 weeks  Sarah, No need for letter Please put her on recall for 5 years RG -

## 2022-10-04 DIAGNOSIS — Z419 Encounter for procedure for purposes other than remedying health state, unspecified: Secondary | ICD-10-CM | POA: Diagnosis not present

## 2022-10-20 ENCOUNTER — Other Ambulatory Visit: Payer: Self-pay | Admitting: Pulmonary Disease

## 2022-10-22 DIAGNOSIS — J449 Chronic obstructive pulmonary disease, unspecified: Secondary | ICD-10-CM | POA: Diagnosis not present

## 2022-10-22 DIAGNOSIS — R0902 Hypoxemia: Secondary | ICD-10-CM | POA: Diagnosis not present

## 2022-10-22 DIAGNOSIS — R0602 Shortness of breath: Secondary | ICD-10-CM | POA: Diagnosis not present

## 2022-11-03 DIAGNOSIS — Z419 Encounter for procedure for purposes other than remedying health state, unspecified: Secondary | ICD-10-CM | POA: Diagnosis not present

## 2022-11-21 DIAGNOSIS — R0602 Shortness of breath: Secondary | ICD-10-CM | POA: Diagnosis not present

## 2022-11-21 DIAGNOSIS — J449 Chronic obstructive pulmonary disease, unspecified: Secondary | ICD-10-CM | POA: Diagnosis not present

## 2022-11-21 DIAGNOSIS — R0902 Hypoxemia: Secondary | ICD-10-CM | POA: Diagnosis not present

## 2022-12-04 ENCOUNTER — Other Ambulatory Visit: Payer: Self-pay | Admitting: Family Medicine

## 2022-12-04 DIAGNOSIS — Z419 Encounter for procedure for purposes other than remedying health state, unspecified: Secondary | ICD-10-CM | POA: Diagnosis not present

## 2022-12-22 DIAGNOSIS — R0602 Shortness of breath: Secondary | ICD-10-CM | POA: Diagnosis not present

## 2022-12-22 DIAGNOSIS — J449 Chronic obstructive pulmonary disease, unspecified: Secondary | ICD-10-CM | POA: Diagnosis not present

## 2022-12-22 DIAGNOSIS — R0902 Hypoxemia: Secondary | ICD-10-CM | POA: Diagnosis not present

## 2022-12-24 DIAGNOSIS — R0902 Hypoxemia: Secondary | ICD-10-CM | POA: Diagnosis not present

## 2022-12-24 DIAGNOSIS — R0602 Shortness of breath: Secondary | ICD-10-CM | POA: Diagnosis not present

## 2022-12-24 DIAGNOSIS — J449 Chronic obstructive pulmonary disease, unspecified: Secondary | ICD-10-CM | POA: Diagnosis not present

## 2023-01-03 DIAGNOSIS — Z419 Encounter for procedure for purposes other than remedying health state, unspecified: Secondary | ICD-10-CM | POA: Diagnosis not present

## 2023-01-18 ENCOUNTER — Ambulatory Visit: Payer: Medicaid Other | Admitting: Gastroenterology

## 2023-01-18 ENCOUNTER — Encounter: Payer: Self-pay | Admitting: Gastroenterology

## 2023-01-18 VITALS — BP 110/70 | HR 72 | Ht 64.0 in | Wt 147.0 lb

## 2023-01-18 DIAGNOSIS — Z8379 Family history of other diseases of the digestive system: Secondary | ICD-10-CM

## 2023-01-18 DIAGNOSIS — K529 Noninfective gastroenteritis and colitis, unspecified: Secondary | ICD-10-CM

## 2023-01-18 NOTE — Progress Notes (Signed)
Chief Complaint: Follow-up from colonoscopy  Referring Provider:  Sandre Kitty, MD      ASSESSMENT AND PLAN;   #1. Nonspecific colitis- suspected mild Crohns  #2. FH Crohns (mom and daughter)  Plan: -Wants to hold off any treatment. -Rpt colon with miralax in 5 yrs.(09/2027).  Earlier, if with any new problems.   HPI:    Jasmin Berger is a 52 y.o. female   For FU Doing great   No GI problems  Here to discuss recent colonoscopy and bx. We have discussed colonoscopy and recent biopsies in detail.  She could have mild Crohn's disease.  She had normal CBC, CMP, CRP and sed rate.  She does not want to try mesalamine or any other medications for Crohn's.  Her daughter and mom both have Crohn's.  She is well aware of the symptoms.  Willing to get follow-up colonoscopy in 5 years.  She will let us know if she has any problems in future.   Past GI WU Colon 09/2022 - Cecal/ proximal ascending colitis with prominent ileocecal valve ( biopsied) - Smudged vascular pattern in distal ascending colon and transverse colon without erosions or ulcers ( biopsied) - Pseudopolyps in the proximal sigmoid colon, in the mid sigmoid colon and in the descending colon. Resected and retrieved. - Non- bleeding internal hemorrhoids. - The examined portion of the ileum was normal. Biopsied. - The examination was otherwise normal on direct and retroflexion views. Bx: 1. Surgical [P], small bowel, terminal ileum CHRONIC ACTIVE ILEITIS. NEGATIVE FOR DYSPLASIA OR MALIGNANCY. 2. Surgical [P], colon, cecum, proximal ascending CHRONIC ACTIVE COLITIS. NEGATIVE FOR GRANULOMA, DYSPLASIA OR MALIGNANCY. 3. Surgical [P], colon, distal ascending, transverse FOCAL MILD ACTIVE COLITIS WITH MILD CRYPT ARCHITECTURE DISARRAY. NEGATIVE FOR GRANULOMA, DYSPLASIA OR MALIGNANCY. 4. Surgical [P], colon, descending, sigmoid, polyp (6) ONE FRAGMENT OF TUBULAR ADENOMA. FRAGMENTS OF INFLAMMATORY POLYP. NEGATIVE FOR  HIGH-GRADE DYSPLASIA. Past Medical History:  Diagnosis Date   Arthritis    COPD (chronic obstructive pulmonary disease) (HCC)    TESTing in 8/24 to verify   Emphysema of lung (HCC)    Testing 8/24 to verify   Heart murmur    Mitral valve prolapse    Oxygen deficiency    Uses at night  1 liter    Past Surgical History:  Procedure Laterality Date   ABDOMINAL HYSTERECTOMY     ADENOIDECTOMY     BREAST BIOPSY Right 08/03/2022   [place] RT BREAST BX W LOC DEV 1ST LESION IMG BX SPEC US GUIDE 08/03/2022 [facility]   CESAREAN SECTION     TYMPANOSTOMY TUBE PLACEMENT      Family History  Problem Relation Age of Onset   Colon polyps Mother    Crohn's disease Mother    Breast cancer Sister    Colon polyps Maternal Aunt    Crohn's disease Daughter    Colon cancer Neg Hx    Esophageal cancer Neg Hx    Rectal cancer Neg Hx    Stomach cancer Neg Hx     Social History   Tobacco Use   Smoking status: Former    Current packs/day: 1.00    Types: Cigarettes   Smokeless tobacco: Never   Tobacco comments:    Pt states she quit 05/21/22  Vaping Use   Vaping status: Never Used  Substance Use Topics   Alcohol use: Yes   Drug use: No    Current Outpatient Medications  Medication Sig Dispense Refill   albuterol (VENTOLIN  HFA) 108 (90 Base) MCG/ACT inhaler Inhale 2 puffs into the lungs every 4 (four) hours as needed for wheezing or shortness of breath. 1 each 11   buPROPion (WELLBUTRIN SR) 150 MG 12 hr tablet TAKE 1 TABLET BY MOUTH TWICE DAILY 60 tablet 3   Loratadine (CLARITIN) 10 MG CAPS Take by mouth.     Multiple Vitamin (MULTIVITAMIN) tablet Take 1 tablet by mouth daily.     STIOLTO RESPIMAT 2.5-2.5 MCG/ACT AERS INHALE 2 PUFFS INTO THE LUNGS DAILY 4 g 5   vitamin B-12 (CYANOCOBALAMIN) 100 MCG tablet Take 100 mcg by mouth daily.     vitamin E 180 MG (400 UNITS) capsule Take 400 Units by mouth daily.     polyethylene glycol powder (GLYCOLAX/MIRALAX) 17 GM/SCOOP powder Take 1  Container by mouth once. (Patient not taking: Reported on 01/18/2023)     No current facility-administered medications for this visit.    Allergies  Allergen Reactions   Latex Rash        Physical Exam:    BP 110/70   Pulse 72   Ht 5\' 4"  (1.626 m)   Wt 147 lb (66.7 kg)   BMI 25.23 kg/m  Wt Readings from Last 3 Encounters:  01/18/23 147 lb (66.7 kg)  09/23/22 130 lb (59 kg)  09/21/22 131 lb 1.9 oz (59.5 kg)   Abdominal: Soft, nondistended. Nontender. Bowel sounds active throughout. There are no masses palpable. No hepatomegaly.  Data Reviewed: I have personally reviewed following labs and imaging studies  CBC:    Latest Ref Rng & Units 05/21/2022    4:07 PM  CBC  WBC 4.0 - 10.5 K/uL 6.4   Hemoglobin 12.0 - 15.0 g/dL 32.9   Hematocrit 51.8 - 46.0 % 41.4   Platelets 150 - 400 K/uL 238     CMP:    Latest Ref Rng & Units 05/27/2022   11:40 AM 05/21/2022    4:07 PM  CMP  Glucose 70 - 99 mg/dL 87  841   BUN 6 - 24 mg/dL 17  13   Creatinine 6.60 - 1.00 mg/dL 6.30  1.60   Sodium 109 - 144 mmol/L 137  130   Potassium 3.5 - 5.2 mmol/L 4.5  4.1   Chloride 96 - 106 mmol/L 95  97   CO2 20 - 29 mmol/L 26  24   Calcium 8.7 - 10.2 mg/dL 9.5  8.6   Total Protein 6.5 - 8.1 g/dL  7.5   Total Bilirubin 0.3 - 1.2 mg/dL  0.4   Alkaline Phos 38 - 126 U/L  69   AST 15 - 41 U/L  35   ALT 0 - 44 U/L  19     No charge for this visit-since it was a follow-up   Edman Circle, MD 01/18/2023, 9:32 AM  Cc: Sandre Kitty, MD

## 2023-01-21 DIAGNOSIS — R0602 Shortness of breath: Secondary | ICD-10-CM | POA: Diagnosis not present

## 2023-01-21 DIAGNOSIS — R0902 Hypoxemia: Secondary | ICD-10-CM | POA: Diagnosis not present

## 2023-01-21 DIAGNOSIS — J449 Chronic obstructive pulmonary disease, unspecified: Secondary | ICD-10-CM | POA: Diagnosis not present

## 2023-01-23 DIAGNOSIS — J449 Chronic obstructive pulmonary disease, unspecified: Secondary | ICD-10-CM | POA: Diagnosis not present

## 2023-01-23 DIAGNOSIS — R0602 Shortness of breath: Secondary | ICD-10-CM | POA: Diagnosis not present

## 2023-01-23 DIAGNOSIS — R0902 Hypoxemia: Secondary | ICD-10-CM | POA: Diagnosis not present

## 2023-02-03 DIAGNOSIS — Z419 Encounter for procedure for purposes other than remedying health state, unspecified: Secondary | ICD-10-CM | POA: Diagnosis not present

## 2023-02-21 DIAGNOSIS — J449 Chronic obstructive pulmonary disease, unspecified: Secondary | ICD-10-CM | POA: Diagnosis not present

## 2023-02-21 DIAGNOSIS — R0902 Hypoxemia: Secondary | ICD-10-CM | POA: Diagnosis not present

## 2023-02-21 DIAGNOSIS — R0602 Shortness of breath: Secondary | ICD-10-CM | POA: Diagnosis not present

## 2023-02-23 DIAGNOSIS — J449 Chronic obstructive pulmonary disease, unspecified: Secondary | ICD-10-CM | POA: Diagnosis not present

## 2023-02-23 DIAGNOSIS — R0602 Shortness of breath: Secondary | ICD-10-CM | POA: Diagnosis not present

## 2023-02-23 DIAGNOSIS — R0902 Hypoxemia: Secondary | ICD-10-CM | POA: Diagnosis not present

## 2023-03-06 DIAGNOSIS — Z419 Encounter for procedure for purposes other than remedying health state, unspecified: Secondary | ICD-10-CM | POA: Diagnosis not present

## 2023-03-24 ENCOUNTER — Encounter: Payer: Self-pay | Admitting: Family Medicine

## 2023-03-24 ENCOUNTER — Ambulatory Visit (INDEPENDENT_AMBULATORY_CARE_PROVIDER_SITE_OTHER): Payer: Medicaid Other | Admitting: Family Medicine

## 2023-03-24 VITALS — BP 118/77 | HR 95 | Ht 64.0 in | Wt 149.4 lb

## 2023-03-24 DIAGNOSIS — J449 Chronic obstructive pulmonary disease, unspecified: Secondary | ICD-10-CM | POA: Diagnosis not present

## 2023-03-24 DIAGNOSIS — R0602 Shortness of breath: Secondary | ICD-10-CM | POA: Diagnosis not present

## 2023-03-24 DIAGNOSIS — R0902 Hypoxemia: Secondary | ICD-10-CM | POA: Diagnosis not present

## 2023-03-24 MED ORDER — BREZTRI AEROSPHERE 160-9-4.8 MCG/ACT IN AERO: 2.0000 | INHALATION_SPRAY | Freq: Two times a day (BID) | RESPIRATORY_TRACT | 11 refills | Status: DC

## 2023-03-24 NOTE — Progress Notes (Signed)
   Established Patient Office Visit  Subjective   Patient ID: Jasmin Berger, female    DOB: December 21, 1970  Age: 53 y.o. MRN: [id number]  Chief Complaint  Patient presents with   Medical Management of Chronic Issues    HPI  COPD-patient states that her Stiolto is not helping as much as It was previously.  She has to use her albuterol Hailer at work sometimes.  We discussed alternatives including Breztri or Trelegy.  Patient agreeable to changing.  Patient continues to take her bupropion.  Patient has no other concerns.   The 10-year ASCVD risk score (Arnett DK, et al., 2019) is: 0.9%  Health Maintenance Due  Topic Date Due   HIV Screening  Never done   Hepatitis C Screening  Never done   INFLUENZA VACCINE  Never done   COVID-19 Vaccine (1 - 2024-25 season) Never done      Objective:     BP 118/77   Pulse 95   Ht 5\' 4"  (1.626 m)   Wt 149 lb 6.4 oz (67.8 kg)   SpO2 96%   BMI 25.64 kg/m    Physical Exam General: Alert, oriented CV: Regular rate and  rhythm Pulmonary: Lungs clear bilaterally   No results found for any visits on 03/24/23.      Assessment & Plan:   Chronic obstructive pulmonary disease, unspecified COPD type (HCC) Assessment & Plan: Patient not adequately controlled on Stiolto.  Will increase to [employer].  Patient has moderate to severe COPD.  Continue albuterol as needed.  Continue bupropion to help with smoking cravings.   Other orders -     [employer]; Inhale 2 puffs into the lungs 2 (two) times daily.  Dispense: 10.7 g; Refill: 11     Return in about 4 months (around 07/22/2023) for physical with Pap.    Sandre Kitty, MD

## 2023-03-24 NOTE — Patient Instructions (Signed)
 It was nice to see you today,  We addressed the following topics today: -I am switching your Stiolto inhaler to something called Markus Daft which is more effective.  This will be 2 puffs twice a day. - I will see back in 4 months for your physical and Pap smear.  Have a great day,  Frederic Jericho, MD

## 2023-03-24 NOTE — Assessment & Plan Note (Addendum)
 Patient not adequately controlled on Stiolto.  Will increase to [employer].  Patient has moderate to severe COPD.  Continue albuterol as needed.  Continue bupropion to help with smoking cravings.

## 2023-03-26 DIAGNOSIS — R0602 Shortness of breath: Secondary | ICD-10-CM | POA: Diagnosis not present

## 2023-03-26 DIAGNOSIS — J449 Chronic obstructive pulmonary disease, unspecified: Secondary | ICD-10-CM | POA: Diagnosis not present

## 2023-03-26 DIAGNOSIS — R0902 Hypoxemia: Secondary | ICD-10-CM | POA: Diagnosis not present

## 2023-03-29 ENCOUNTER — Other Ambulatory Visit: Payer: Self-pay | Admitting: Family Medicine

## 2023-03-29 MED ORDER — ANORO ELLIPTA 62.5-25 MCG/ACT IN AEPB: 1.0000 | INHALATION_SPRAY | Freq: Every day | RESPIRATORY_TRACT | 2 refills | Status: DC

## 2023-03-29 NOTE — Progress Notes (Signed)
 Declined breztri.  Needs to fail a second maintenance inahler.  Has already failed stiolto.

## 2023-04-03 DIAGNOSIS — Z419 Encounter for procedure for purposes other than remedying health state, unspecified: Secondary | ICD-10-CM | POA: Diagnosis not present

## 2023-04-21 DIAGNOSIS — R0902 Hypoxemia: Secondary | ICD-10-CM | POA: Diagnosis not present

## 2023-04-21 DIAGNOSIS — R0602 Shortness of breath: Secondary | ICD-10-CM | POA: Diagnosis not present

## 2023-04-21 DIAGNOSIS — J449 Chronic obstructive pulmonary disease, unspecified: Secondary | ICD-10-CM | POA: Diagnosis not present

## 2023-04-23 DIAGNOSIS — R0602 Shortness of breath: Secondary | ICD-10-CM | POA: Diagnosis not present

## 2023-04-23 DIAGNOSIS — R0902 Hypoxemia: Secondary | ICD-10-CM | POA: Diagnosis not present

## 2023-04-23 DIAGNOSIS — J449 Chronic obstructive pulmonary disease, unspecified: Secondary | ICD-10-CM | POA: Diagnosis not present

## 2023-05-15 DIAGNOSIS — Z419 Encounter for procedure for purposes other than remedying health state, unspecified: Secondary | ICD-10-CM | POA: Diagnosis not present

## 2023-06-06 DIAGNOSIS — R0602 Shortness of breath: Secondary | ICD-10-CM | POA: Diagnosis not present

## 2023-06-06 DIAGNOSIS — J449 Chronic obstructive pulmonary disease, unspecified: Secondary | ICD-10-CM | POA: Diagnosis not present

## 2023-06-06 DIAGNOSIS — R0902 Hypoxemia: Secondary | ICD-10-CM | POA: Diagnosis not present

## 2023-06-14 DIAGNOSIS — Z419 Encounter for procedure for purposes other than remedying health state, unspecified: Secondary | ICD-10-CM | POA: Diagnosis not present

## 2023-06-21 DIAGNOSIS — R0902 Hypoxemia: Secondary | ICD-10-CM | POA: Diagnosis not present

## 2023-06-21 DIAGNOSIS — J449 Chronic obstructive pulmonary disease, unspecified: Secondary | ICD-10-CM | POA: Diagnosis not present

## 2023-06-21 DIAGNOSIS — R0602 Shortness of breath: Secondary | ICD-10-CM | POA: Diagnosis not present

## 2023-06-23 ENCOUNTER — Telehealth: Payer: Self-pay

## 2023-06-23 ENCOUNTER — Ambulatory Visit (INDEPENDENT_AMBULATORY_CARE_PROVIDER_SITE_OTHER): Admitting: Pulmonary Disease

## 2023-06-23 ENCOUNTER — Encounter: Payer: Self-pay | Admitting: Pulmonary Disease

## 2023-06-23 VITALS — BP 143/82 | HR 103 | Ht 64.0 in | Wt 155.0 lb

## 2023-06-23 DIAGNOSIS — Z87891 Personal history of nicotine dependence: Secondary | ICD-10-CM | POA: Diagnosis not present

## 2023-06-23 DIAGNOSIS — J439 Emphysema, unspecified: Secondary | ICD-10-CM

## 2023-06-23 DIAGNOSIS — J441 Chronic obstructive pulmonary disease with (acute) exacerbation: Secondary | ICD-10-CM

## 2023-06-23 MED ORDER — BREZTRI AEROSPHERE 160-9-4.8 MCG/ACT IN AERO: INHALATION_SPRAY | RESPIRATORY_TRACT | Status: DC

## 2023-06-23 MED ORDER — BREZTRI AEROSPHERE 160-9-4.8 MCG/ACT IN AERO: 2.0000 | INHALATION_SPRAY | Freq: Two times a day (BID) | RESPIRATORY_TRACT | 6 refills | Status: DC

## 2023-06-23 NOTE — Telephone Encounter (Signed)
*  Pulm  Pharmacy Patient Advocate Encounter   Received notification from CoverMyMeds that prior authorization for Breztri  Aerosphere 160-9-4.8MCG/ACT aerosol  is required/requested.   Insurance verification completed.   The patient is insured through [facility] .   Per test claim: PA required; PA started via CoverMyMeds. KEY [id number] . Please see clinical question(s) below that I am not finding the answer to in her chart and advise.   Which two preferred inhaled corticosteroid combination medications has the member had an inadequate response to after a therapeutic trial with each?  -Advair Diskus -Advair HFA -Symbicort -Dulera

## 2023-06-23 NOTE — Addendum Note (Signed)
 Addended by: Luana Rumple R on: 06/23/2023 12:00 PM   Modules accepted: Orders

## 2023-06-23 NOTE — Patient Instructions (Signed)
 I will see you in about 6 months  We will get a breathing study at your next visit  I have provided a prescription for Breztri   Will provide you with a letter for work accommodation  Call us  with significant concerns

## 2023-06-23 NOTE — Progress Notes (Signed)
 Jasmin Berger    [id number]    1970/11/20  Primary Care Physician:Olson, Linford Ribas, MD  Referring Physician: Laneta Pintos, MD [address] [place],  [place] [address]  Chief complaint:   Follow-up for obstructive lung disease  HPI:  Off cigarettes  She is doing relatively well Still having significant symptoms  Current inhaler Anoro does not seem to be working as well  She was advised that she may be on Breztri  - Will make sure she gets Breztri  samples and provide a prescription  Will plan to repeat pulmonary function test at next visit in 6 months  Recent CT scan significant for emphysema Pulmonary function test shows moderate obstructive disease with severe reduction in diffusing capacity  Was smoking about 2 packs a day for about 25 years  She still does use oxygen  at night - She does feel more short of breath at night sometimes and feels oxygen  supplementation does help  More wheezing and shortness of breath and work environments lately  Usually able to get chores done  she does housecleaning Has worked as a IT consultant, an Airline pilot in the past  Outpatient Encounter Medications as of 06/23/2023  Medication Sig   albuterol  (VENTOLIN  HFA) 108 (90 Base) MCG/ACT inhaler Inhale 2 puffs into the lungs every 4 (four) hours as needed for wheezing or shortness of breath.   buPROPion  (WELLBUTRIN  SR) 150 MG 12 hr tablet TAKE 1 TABLET BY MOUTH TWICE DAILY   Loratadine (CLARITIN) 10 MG CAPS Take by mouth.   Multiple Vitamin (MULTIVITAMIN) tablet Take 1 tablet by mouth daily.   polyethylene glycol powder (GLYCOLAX/MIRALAX) 17 GM/SCOOP powder Take 1 Container by mouth once.   umeclidinium-vilanterol (ANORO ELLIPTA ) 62.5-25 MCG/ACT AEPB Inhale 1 puff into the lungs daily.   vitamin B-12 (CYANOCOBALAMIN ) 100 MCG tablet Take 100 mcg by mouth daily.   vitamin E 180 MG (400 UNITS) capsule Take 400 Units by mouth daily.   No facility-administered encounter  medications on file as of 06/23/2023.    Allergies as of 06/23/2023 - Review Complete 06/23/2023  Allergen Reaction Noted   Latex Rash 06/19/2022    Past Medical History:  Diagnosis Date   Arthritis    COPD (chronic obstructive pulmonary disease) (HCC)    TESTing in 8/24 to verify   Emphysema of lung (HCC)    Testing 8/24 to verify   Heart murmur    Mitral valve prolapse    Oxygen  deficiency    Uses at night  1 liter    Past Surgical History:  Procedure Laterality Date   ABDOMINAL HYSTERECTOMY     ADENOIDECTOMY     BREAST BIOPSY Right 08/03/2022   US  RT BREAST BX W LOC DEV 1ST LESION IMG BX SPEC US  GUIDE 08/03/2022 [facility]   CESAREAN SECTION     TYMPANOSTOMY TUBE PLACEMENT      Family History  Problem Relation Age of Onset   Colon polyps Mother    Crohn's disease Mother    Breast cancer Sister    Colon polyps Maternal Aunt    Crohn's disease Daughter    Colon cancer Neg Hx    Esophageal cancer Neg Hx    Rectal cancer Neg Hx    Stomach cancer Neg Hx     Social History   Socioeconomic History   Marital status: Single    Spouse name: Not on file   Number of children: Not on file   Years of education:  Not on file   Highest education level: Not on file  Occupational History   Not on file  Tobacco Use   Smoking status: Former    Current packs/day: 1.00    Types: Cigarettes   Smokeless tobacco: Never   Tobacco comments:    Pt states she quit 05/21/22  Vaping Use   Vaping status: Never Used  Substance and Sexual Activity   Alcohol use: Yes   Drug use: No   Sexual activity: Not Currently    Birth control/protection: Surgical  Other Topics Concern   Not on file  Social History Narrative   Not on file   Social Drivers of Health   Financial Resource Strain: Not on file  Food Insecurity: Not on file  Transportation Needs: Not on file  Physical Activity: Not on file  Stress: Not on file  Social Connections: Not on file  Intimate Partner  Violence: Not on file    Review of Systems  Constitutional:  Negative for fatigue.  Respiratory:  Positive for cough and shortness of breath.     Vitals:   06/23/23 0923  BP: (!) 143/82  Pulse: (!) 103  SpO2: 93%     Physical Exam Constitutional:      Appearance: Normal appearance.  HENT:     Head: Normocephalic.     Mouth/Throat:     Mouth: Mucous membranes are moist.  Eyes:     General: No scleral icterus. Cardiovascular:     Rate and Rhythm: Normal rate and regular rhythm.     Heart sounds: No murmur heard.    No friction rub.  Pulmonary:     Effort: No respiratory distress.     Breath sounds: No stridor. No wheezing or rhonchi.     Comments: Decreased air movement bilaterally Musculoskeletal:     Cervical back: No rigidity or tenderness.  Neurological:     Mental Status: She is alert.  Psychiatric:        Mood and Affect: Mood normal.    Data Reviewed: Recent chest x-ray reviewed -No acute infiltrative process, hyperinflated lung fields  CT scan reviewed showing emphysema  PFT with moderate obstructive disease with severe reduction in diffusing capacity  Assessment:  Moderate obstructive pulmonary disease with severe reduction in diffusing capacity  Emphysema  History of hypoxemic respiratory failure, on oxygen  as needed  Having some more symptoms and work environments   Plan/Recommendations:  Prescription for Breztri   Will provide samples of Breztri   Follow-up in 6 months  Will repeat PFT in 6 months  Will provide a letter for work accommodations  Encouraged to call with significant concerns      Myer Artis MD [facility] 06/23/2023, 9:24 AM  CC: Laneta Pintos, MD

## 2023-06-25 NOTE — Telephone Encounter (Signed)
 Please advise which alternative Dr. Gaynell Keeler

## 2023-06-30 NOTE — Telephone Encounter (Signed)
 CMM request has expired. Closing due to non-response

## 2023-07-07 DIAGNOSIS — R0602 Shortness of breath: Secondary | ICD-10-CM | POA: Diagnosis not present

## 2023-07-07 DIAGNOSIS — J449 Chronic obstructive pulmonary disease, unspecified: Secondary | ICD-10-CM | POA: Diagnosis not present

## 2023-07-07 DIAGNOSIS — R0902 Hypoxemia: Secondary | ICD-10-CM | POA: Diagnosis not present

## 2023-07-13 ENCOUNTER — Other Ambulatory Visit: Payer: Self-pay | Admitting: *Deleted

## 2023-07-13 ENCOUNTER — Other Ambulatory Visit: Payer: Self-pay | Admitting: Pulmonary Disease

## 2023-07-13 ENCOUNTER — Other Ambulatory Visit: Payer: Self-pay | Admitting: Family Medicine

## 2023-07-13 DIAGNOSIS — Z1231 Encounter for screening mammogram for malignant neoplasm of breast: Secondary | ICD-10-CM

## 2023-07-13 DIAGNOSIS — Z87891 Personal history of nicotine dependence: Secondary | ICD-10-CM

## 2023-07-13 DIAGNOSIS — J449 Chronic obstructive pulmonary disease, unspecified: Secondary | ICD-10-CM

## 2023-07-13 DIAGNOSIS — Z131 Encounter for screening for diabetes mellitus: Secondary | ICD-10-CM

## 2023-07-15 ENCOUNTER — Other Ambulatory Visit: Payer: Medicaid Other

## 2023-07-15 ENCOUNTER — Telehealth: Payer: Self-pay

## 2023-07-15 DIAGNOSIS — Z131 Encounter for screening for diabetes mellitus: Secondary | ICD-10-CM

## 2023-07-15 DIAGNOSIS — J449 Chronic obstructive pulmonary disease, unspecified: Secondary | ICD-10-CM

## 2023-07-15 DIAGNOSIS — Z87891 Personal history of nicotine dependence: Secondary | ICD-10-CM | POA: Diagnosis not present

## 2023-07-15 DIAGNOSIS — Z419 Encounter for procedure for purposes other than remedying health state, unspecified: Secondary | ICD-10-CM | POA: Diagnosis not present

## 2023-07-15 NOTE — Telephone Encounter (Signed)
 Copied from CRM [id number]. Topic: Clinical - Prescription Issue >> Jul 14, 2023 10:58 AM Alverda Joe S wrote: Reason for CRM: patient is calling bc there is an issue with her insurance and inhaler prescription, she wants to know what she do after she finishes her samples which she will be finished by Thursday  Spoke with patient regarding prior message. Advised patient I will send this message to our PA team to fine out if patient's insurance approves of her medication or other option's . Advised [patient I will leave a few samples up front for patient to cone into the office with her ID and pick them up.  PA team can you please look to see if patient needs a PA .

## 2023-07-16 ENCOUNTER — Ambulatory Visit: Payer: Self-pay | Admitting: Family Medicine

## 2023-07-16 LAB — CBC WITH DIFFERENTIAL/PLATELET
Basophils Absolute: 0.1 10*3/uL (ref 0.0–0.2)
Basos: 1 %
EOS (ABSOLUTE): 0.3 10*3/uL (ref 0.0–0.4)
Eos: 3 %
Hematocrit: 33 % — ABNORMAL LOW (ref 34.0–46.6)
Hemoglobin: 10.4 g/dL — ABNORMAL LOW (ref 11.1–15.9)
Immature Grans (Abs): 0.1 10*3/uL (ref 0.0–0.1)
Immature Granulocytes: 1 %
Lymphocytes Absolute: 1 10*3/uL (ref 0.7–3.1)
Lymphs: 11 %
MCH: 31.8 pg (ref 26.6–33.0)
MCHC: 31.5 g/dL (ref 31.5–35.7)
MCV: 101 fL — ABNORMAL HIGH (ref 79–97)
Monocytes Absolute: 0.9 10*3/uL (ref 0.1–0.9)
Monocytes: 10 %
Neutrophils Absolute: 6.9 10*3/uL (ref 1.4–7.0)
Neutrophils: 74 %
Platelets: 549 10*3/uL — ABNORMAL HIGH (ref 150–450)
RBC: 3.27 x10E6/uL — ABNORMAL LOW (ref 3.77–5.28)
RDW: 11.9 % (ref 11.7–15.4)
WBC: 9.2 10*3/uL (ref 3.4–10.8)

## 2023-07-16 LAB — LIPID PANEL
Chol/HDL Ratio: 3.4 ratio (ref 0.0–4.4)
Cholesterol, Total: 208 mg/dL — ABNORMAL HIGH (ref 100–199)
HDL: 61 mg/dL (ref >39)
LDL Chol Calc (NIH): 132 mg/dL — ABNORMAL HIGH (ref 0–99)
Triglycerides: 84 mg/dL (ref 0–149)
VLDL Cholesterol Cal: 15 mg/dL (ref 5–40)

## 2023-07-16 LAB — COMPREHENSIVE METABOLIC PANEL WITH GFR
ALT: 10 IU/L (ref 0–32)
AST: 21 IU/L (ref 0–40)
Albumin: 3.8 g/dL (ref 3.8–4.9)
Alkaline Phosphatase: 89 IU/L (ref 44–121)
BUN/Creatinine Ratio: 16 (ref 9–23)
BUN: 11 mg/dL (ref 6–24)
Bilirubin Total: <0.2 mg/dL (ref 0.0–1.2)
CO2: 20 mmol/L (ref 20–29)
Calcium: 9.4 mg/dL (ref 8.7–10.2)
Chloride: 97 mmol/L (ref 96–106)
Creatinine, Ser: 0.68 mg/dL (ref 0.57–1.00)
Globulin, Total: 2.8 g/dL (ref 1.5–4.5)
Glucose: 105 mg/dL — ABNORMAL HIGH (ref 70–99)
Potassium: 4.8 mmol/L (ref 3.5–5.2)
Sodium: 134 mmol/L (ref 134–144)
Total Protein: 6.6 g/dL (ref 6.0–8.5)
eGFR: 103 mL/min/{1.73_m2} (ref >59)

## 2023-07-16 LAB — HEMOGLOBIN A1C
Est. average glucose Bld gHb Est-mCnc: 105 mg/dL
Hgb A1c MFr Bld: 5.3 % (ref 4.8–5.6)

## 2023-07-19 NOTE — Telephone Encounter (Signed)
 Which two preferred inhaled corticosteroid combination medications has the member had an inadequate response to after a therapeutic trial with each?   -Advair Diskus -Advair HFA -Symbicort -Dulera

## 2023-07-19 NOTE — Telephone Encounter (Signed)
 Brezteri

## 2023-07-19 NOTE — Telephone Encounter (Signed)
 Which inhaler? There was a previous request for additional information sent to the office which was never responded to so I could not complete that PA.

## 2023-07-20 DIAGNOSIS — H2512 Age-related nuclear cataract, left eye: Secondary | ICD-10-CM | POA: Diagnosis not present

## 2023-07-20 DIAGNOSIS — H43812 Vitreous degeneration, left eye: Secondary | ICD-10-CM | POA: Diagnosis not present

## 2023-07-20 DIAGNOSIS — H2521 Age-related cataract, morgagnian type, right eye: Secondary | ICD-10-CM | POA: Diagnosis not present

## 2023-07-20 DIAGNOSIS — H25012 Cortical age-related cataract, left eye: Secondary | ICD-10-CM | POA: Diagnosis not present

## 2023-07-22 ENCOUNTER — Ambulatory Visit (INDEPENDENT_AMBULATORY_CARE_PROVIDER_SITE_OTHER): Payer: Medicaid Other | Admitting: Family Medicine

## 2023-07-22 ENCOUNTER — Encounter: Payer: Self-pay | Admitting: Family Medicine

## 2023-07-22 ENCOUNTER — Other Ambulatory Visit (HOSPITAL_COMMUNITY)
Admission: RE | Admit: 2023-07-22 | Discharge: 2023-07-22 | Disposition: A | Discharge status: Home / Self Care | Source: Ambulatory Visit | Attending: Family Medicine | Admitting: Family Medicine

## 2023-07-22 VITALS — BP 136/85 | HR 79 | Ht 64.0 in | Wt 151.0 lb

## 2023-07-22 DIAGNOSIS — Z01419 Encounter for gynecological examination (general) (routine) without abnormal findings: Secondary | ICD-10-CM | POA: Diagnosis not present

## 2023-07-22 DIAGNOSIS — D509 Iron deficiency anemia, unspecified: Secondary | ICD-10-CM | POA: Diagnosis not present

## 2023-07-22 DIAGNOSIS — J449 Chronic obstructive pulmonary disease, unspecified: Secondary | ICD-10-CM

## 2023-07-22 DIAGNOSIS — E782 Mixed hyperlipidemia: Secondary | ICD-10-CM

## 2023-07-22 DIAGNOSIS — G2581 Restless legs syndrome: Secondary | ICD-10-CM | POA: Diagnosis not present

## 2023-07-22 DIAGNOSIS — Z0000 Encounter for general adult medical examination without abnormal findings: Secondary | ICD-10-CM

## 2023-07-22 DIAGNOSIS — R0902 Hypoxemia: Secondary | ICD-10-CM | POA: Diagnosis not present

## 2023-07-22 DIAGNOSIS — R0602 Shortness of breath: Secondary | ICD-10-CM | POA: Diagnosis not present

## 2023-07-22 MED ORDER — GABAPENTIN 300 MG PO CAPS: 300.0000 mg | ORAL_CAPSULE | Freq: Every day | ORAL | 1 refills | Status: DC

## 2023-07-22 NOTE — Patient Instructions (Signed)
 It was nice to see you today,  We addressed the following topics today: -I have sent in a prescription for gabapentin.  Take this 30 minutes to an hour before bedtime to help with restless leg and sleep. - If you feel like the doses are too high let us  know and I can send in a lower dose - In a month we will check your labs to see if your anemia has improved and follow-up on your restless leg.  Have a great day,  Etha Henle, MD

## 2023-07-22 NOTE — Progress Notes (Unsigned)
   Annual physical  Subjective    Patient ID: DELOISE MARCHANT, female    DOB: [birth year]  Age: 53 y.o. MRN: [id number]  Chief Complaint  Patient presents with   Annual Exam   Gynecologic Exam   HPI Skyley is a 53 y.o. old female here  for annual exam.   Work:*** Relationship:*** Children:*** Tobacco:*** Alcohol:*** Recreational drugs:*** Menstruation:*** Diet:*** Exercise:***  Family history of breast, ovarian, endometrial, colorectal cancer:***  Advance directive:***  Other providers:***  Dr. Roslynn Coombes - ophtho.     The patient has the following chronic health issues that are monitored on a routine basis: ***  The 10-year ASCVD risk score (Arnett DK, et al., 2019) is: 2.1%   HPI  Separate, acute concerns today: ***  The 10-year ASCVD risk score (Arnett DK, et al., 2019) is: 2.1%  Health Maintenance Due  Topic Date Due   HIV Screening  Never done   Hepatitis C Screening  Never done   COVID-19 Vaccine (1 - 2024-25 season) Never done      Objective:     BP 136/85   Pulse 79   Ht 5' 4 (1.626 m)   Wt 151 lb (68.5 kg)   SpO2 96%   BMI 25.92 kg/m  {Vitals History (Optional):23777}  Physical Exam   No results found for any visits on 07/22/23.      Assessment & Plan:   Well woman exam -     Cytology - PAP     No follow-ups on file.    Laneta Pintos, MD

## 2023-07-28 DIAGNOSIS — G2581 Restless legs syndrome: Secondary | ICD-10-CM | POA: Insufficient documentation

## 2023-07-28 DIAGNOSIS — E785 Hyperlipidemia, unspecified: Secondary | ICD-10-CM | POA: Insufficient documentation

## 2023-07-28 LAB — CYTOLOGY - PAP
Comment: NEGATIVE
Diagnosis: UNDETERMINED — AB
High risk HPV: NEGATIVE

## 2023-07-28 NOTE — Assessment & Plan Note (Signed)
-   Start gabapentin  300mg  PO at bedtime - Return in 1 month to assess efficacy - May decrease to 100mg  if excessive drowsiness

## 2023-07-28 NOTE — Assessment & Plan Note (Signed)
-   Slightly elevated, not requiring medication at this time - Dietary counseling provided: limit saturated fats (red meat, dairy) - Recheck lipids at next visit

## 2023-07-28 NOTE — Assessment & Plan Note (Signed)
-   Currently using Breztri  samples from pulmonologist - Insurance approval pending - Previous trials: Stelato (effective when not at work), Anoro (ineffective)

## 2023-08-03 ENCOUNTER — Ambulatory Visit: Payer: Self-pay | Admitting: Family Medicine

## 2023-08-04 MED ORDER — BREZTRI AEROSPHERE 160-9-4.8 MCG/ACT IN AERO: INHALATION_SPRAY | RESPIRATORY_TRACT | Status: DC

## 2023-08-04 NOTE — Telephone Encounter (Signed)
 Copied from CRM [id number]. Topic: Clinical - Medication Prior Auth >> Aug 04, 2023  9:01 AM Chantha C wrote: Reason for CRM: Patient [phone] states still does have budesonide-glycopyrrolate-formoterol (BREZTRI  AEROSPHERE) 160-9-4.8 MCG/ACT AERO inhaler still and will be running out of the samples. Patient is asking on the status of the PA? Please advise and call back.  Still awaiting Dr. Neda response per previous encounter regarding this situation. Dr. Neda please advise which alternative you prefer. I will provide pt with 1 sample to last until Dr. Neda gets back from vacation.  Pt is aware & verbalized understanding.  Will await response

## 2023-08-06 DIAGNOSIS — R0902 Hypoxemia: Secondary | ICD-10-CM | POA: Diagnosis not present

## 2023-08-06 DIAGNOSIS — J449 Chronic obstructive pulmonary disease, unspecified: Secondary | ICD-10-CM | POA: Diagnosis not present

## 2023-08-06 DIAGNOSIS — R0602 Shortness of breath: Secondary | ICD-10-CM | POA: Diagnosis not present

## 2023-08-09 DIAGNOSIS — H2521 Age-related cataract, morgagnian type, right eye: Secondary | ICD-10-CM | POA: Diagnosis not present

## 2023-08-09 DIAGNOSIS — H25811 Combined forms of age-related cataract, right eye: Secondary | ICD-10-CM | POA: Diagnosis not present

## 2023-08-09 DIAGNOSIS — H268 Other specified cataract: Secondary | ICD-10-CM | POA: Diagnosis not present

## 2023-08-12 ENCOUNTER — Ambulatory Visit

## 2023-08-14 DIAGNOSIS — Z419 Encounter for procedure for purposes other than remedying health state, unspecified: Secondary | ICD-10-CM | POA: Diagnosis not present

## 2023-08-16 NOTE — Telephone Encounter (Signed)
 Was the prior authorization completed for Breztri ?  If Breztri  is not covered, can place an order for Trelegy 100, one puff daily to see if this is covered

## 2023-08-17 ENCOUNTER — Other Ambulatory Visit

## 2023-08-17 MED ORDER — TRELEGY ELLIPTA 100-62.5-25 MCG/ACT IN AEPB: 1.0000 | INHALATION_SPRAY | Freq: Every day | RESPIRATORY_TRACT | 2 refills | Status: DC

## 2023-08-17 NOTE — Telephone Encounter (Signed)
 Trelegy sent to pharmacy to replace Breztri  per Dr Neda, pt notified

## 2023-08-17 NOTE — Telephone Encounter (Signed)
 Patient is calling to see what's going on with the breztri  because she is almost out of the sample she received patient says nurse said that he would probably put her on a different inhaler. Patient is waiting to see when will inhaler be sent to pharmacy. Please reachj out to patient with more information concerning what's going on [phone]

## 2023-08-18 ENCOUNTER — Other Ambulatory Visit

## 2023-08-18 DIAGNOSIS — D509 Iron deficiency anemia, unspecified: Secondary | ICD-10-CM

## 2023-08-19 LAB — CBC WITH DIFFERENTIAL/PLATELET
Basophils Absolute: 0 x10E3/uL (ref 0.0–0.2)
Basos: 1 %
EOS (ABSOLUTE): 0.2 x10E3/uL (ref 0.0–0.4)
Eos: 2 %
Hematocrit: 36.9 % (ref 34.0–46.6)
Hemoglobin: 11.5 g/dL (ref 11.1–15.9)
Immature Grans (Abs): 0 x10E3/uL (ref 0.0–0.1)
Immature Granulocytes: 0 %
Lymphocytes Absolute: 1 x10E3/uL (ref 0.7–3.1)
Lymphs: 16 %
MCH: 30.2 pg (ref 26.6–33.0)
MCHC: 31.2 g/dL — ABNORMAL LOW (ref 31.5–35.7)
MCV: 97 fL (ref 79–97)
Monocytes Absolute: 0.8 x10E3/uL (ref 0.1–0.9)
Monocytes: 12 %
Neutrophils Absolute: 4.6 x10E3/uL (ref 1.4–7.0)
Neutrophils: 69 %
Platelets: 351 x10E3/uL (ref 150–450)
RBC: 3.81 x10E6/uL (ref 3.77–5.28)
RDW: 12.8 % (ref 11.7–15.4)
WBC: 6.6 x10E3/uL (ref 3.4–10.8)

## 2023-08-19 LAB — IRON,TIBC AND FERRITIN PANEL
Ferritin: 14 ng/mL — ABNORMAL LOW (ref 15–150)
Iron Saturation: 10 % — ABNORMAL LOW (ref 15–55)
Iron: 47 ug/dL (ref 27–159)
Total Iron Binding Capacity: 474 ug/dL — ABNORMAL HIGH (ref 250–450)
UIBC: 427 ug/dL — ABNORMAL HIGH (ref 131–425)

## 2023-08-21 DIAGNOSIS — J449 Chronic obstructive pulmonary disease, unspecified: Secondary | ICD-10-CM | POA: Diagnosis not present

## 2023-08-21 DIAGNOSIS — R0602 Shortness of breath: Secondary | ICD-10-CM | POA: Diagnosis not present

## 2023-08-21 DIAGNOSIS — R0902 Hypoxemia: Secondary | ICD-10-CM | POA: Diagnosis not present

## 2023-08-24 ENCOUNTER — Ambulatory Visit (INDEPENDENT_AMBULATORY_CARE_PROVIDER_SITE_OTHER): Admitting: Family Medicine

## 2023-08-24 ENCOUNTER — Encounter: Payer: Self-pay | Admitting: Family Medicine

## 2023-08-24 VITALS — BP 144/88 | HR 99 | Ht 64.0 in | Wt 152.0 lb

## 2023-08-24 DIAGNOSIS — R030 Elevated blood-pressure reading, without diagnosis of hypertension: Secondary | ICD-10-CM | POA: Insufficient documentation

## 2023-08-24 DIAGNOSIS — E611 Iron deficiency: Secondary | ICD-10-CM

## 2023-08-24 DIAGNOSIS — G2581 Restless legs syndrome: Secondary | ICD-10-CM | POA: Diagnosis not present

## 2023-08-24 DIAGNOSIS — D508 Other iron deficiency anemias: Secondary | ICD-10-CM | POA: Diagnosis not present

## 2023-08-24 DIAGNOSIS — J449 Chronic obstructive pulmonary disease, unspecified: Secondary | ICD-10-CM

## 2023-08-24 DIAGNOSIS — Z124 Encounter for screening for malignant neoplasm of cervix: Secondary | ICD-10-CM

## 2023-08-24 DIAGNOSIS — D509 Iron deficiency anemia, unspecified: Secondary | ICD-10-CM | POA: Insufficient documentation

## 2023-08-24 MED ORDER — IRON (FERROUS SULFATE) 325 (65 FE) MG PO TABS
325.0000 mg | ORAL_TABLET | ORAL | 5 refills | Status: AC
Start: 2023-08-24 — End: ?

## 2023-08-24 NOTE — Patient Instructions (Addendum)
 It was nice to see you today,  We addressed the following topics today: -No changes to your medications today.  Continue the gabapentin . - In 3 months we can recheck your iron  levels - Your iron  levels were little low, I would recommend taking ferrous sulfate  325 mg every other day.  I will send in a prescription but you can also pick it up over-the-counter. - We will recheck your Pap smear in 1 year - If you have any concerns or questions about your inhaler status let us  know. - Please check your blood pressure 1-2 times a day for the next 2 weeks and then either bring in the results or send them through MyChart message.  And can you handle her blood pressure log  Have a great day,  Rolan Slain, MD

## 2023-08-24 NOTE — Assessment & Plan Note (Signed)
 Patient reports recent dyspnea with oxygen  desaturation to 90% at home. Has completed the course of Breztri . Insurance denied a refill, requiring trials of preferred alternatives first. Awaiting prior authorization for Trelegy, which is being managed by Dr. Valorie. - Continue current management. - If there are issues with obtaining Trelegy, will facilitate prescription of alternative agents to meet insurance requirements. - Advised to message if any assistance is needed with the inhaler. - medications tried: stiolto respimat , albuterol 

## 2023-08-24 NOTE — Assessment & Plan Note (Signed)
 Reports good control of symptoms with gabapentin  300 mg nightly. No reported side effects. - Continue gabapentin  300 mg at bedtime. -Iron  supp may help as well.

## 2023-08-24 NOTE — Assessment & Plan Note (Signed)
 Blood pressure was elevated in office today. Last reading was within normal limits. - Instructed to monitor blood pressure at home for one week and message results. - No changes to medications at this time.

## 2023-08-24 NOTE — Assessment & Plan Note (Signed)
 Recent labs from July showed low ferritin and low iron  saturation, consistent with iron  deficiency anemia, despite daily multivitamin use. - Recommended starting over-the-counter ferrous sulfate  325 mg every other day to improve absorption and minimize side effects. Prescription also sent. - Recheck iron  levels via lab visit in 3 months.

## 2023-08-24 NOTE — Assessment & Plan Note (Signed)
 Recent pap smear showed ASC-US , HPV negative. Given a history of a prior positive HPV result, closer follow-up is warranted. - Repeat pap smear in one year.

## 2023-08-24 NOTE — Progress Notes (Signed)
 Established Patient Office Visit  Subjective   Patient ID: Jasmin Berger, female    DOB: 1970-06-12  Age: 53 y.o. MRN: [id number]  Chief Complaint  Patient presents with   Medical Management of Chronic Issues    HPI  Subjective - Experienced dyspnea yesterday with oxygen  saturation dropping to 90% at home. - Has used all of the Breztri  inhaler. Insurance denied the refill, stating at least two preferred drugs (e.g., Advair, Symbicort) must be tried first. Awaiting prior authorization for Trelegy from Dr. Milissa office. - Reports Gabapentin  is effective for restless legs syndrome. Denies side effects such as grogginess.  Medications Current medications include gabapentin  300 mg at night. Has been taking a multivitamin for a long time. Was previously on Breztri , but has run out. Previous trial of Advair was ineffective.  PMH, PSH, FH, Social Hx PMHx: COPD, restless legs syndrome, iron  deficiency anemia. History of pregnancy-induced anemia requiring iron  supplementation. 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  ROS Constitutional: Denies grogginess from gabapentin . Respiratory: Reports dyspnea with exertion.    The 10-year ASCVD risk score (Arnett DK, et al., 2019) is: 2.4%  Health Maintenance Due  Topic Date Due   HIV Screening  Never done   Hepatitis C Screening  Never done   Hepatitis B Vaccines (1 of 3 - 19+ 3-dose series) Never done   COVID-19 Vaccine (1 - 2024-25 season) Never done      Objective:     BP (!) 144/88   Pulse 99   Ht 5' 4 (1.626 m)   Wt 152 lb (68.9 kg)   SpO2 97%   BMI 26.09 kg/m    Physical Exam Gen: alert, oriented Pulm: no resp distress, occasional dry cough.   Psych: pleasant affect.     No results found for any visits on 08/24/23.      Assessment & Plan:   Chronic obstructive pulmonary disease, unspecified COPD type (HCC) Assessment & Plan: Patient reports recent dyspnea with oxygen  desaturation to 90% at home. Has completed  the course of Breztri . Insurance denied a refill, requiring trials of preferred alternatives first. Awaiting prior authorization for Trelegy, which is being managed by Dr. Valorie. - Continue current management. - If there are issues with obtaining Trelegy, will facilitate prescription of alternative agents to meet insurance requirements. - Advised to message if any assistance is needed with the inhaler. - medications tried: stiolto respimat , albuterol    Iron  deficiency -     Iron , TIBC and Ferritin Panel; Future  Elevated BP without diagnosis of hypertension Assessment & Plan: Blood pressure was elevated in office today. Last reading was within normal limits. - Instructed to monitor blood pressure at home for one week and message results. - No changes to medications at this time.   Other iron  deficiency anemia Assessment & Plan: Recent labs from July showed low ferritin and low iron  saturation, consistent with iron  deficiency anemia, despite daily multivitamin use. - Recommended starting over-the-counter ferrous sulfate  325 mg every other day to improve absorption and minimize side effects. Prescription also sent. - Recheck iron  levels via lab visit in 3 months.   Pap smear for cervical cancer screening Assessment & Plan: Recent pap smear showed ASC-US , HPV negative. Given a history of a prior positive HPV result, closer follow-up is warranted. - Repeat pap smear in one year.   RLS (restless legs syndrome) Assessment & Plan: Reports good control of symptoms with gabapentin  300 mg nightly. No reported side effects. - Continue gabapentin  300 mg  at bedtime. -Iron  supp may help as well.     Other orders -     Iron  (Ferrous Sulfate ); Take 325 mg by mouth every other day.  Dispense: 30 tablet; Refill: 5     Return in about 6 months (around 02/24/2024) for HTN, anemia.    Toribio MARLA Slain, MD

## 2023-08-25 ENCOUNTER — Telehealth: Payer: Self-pay

## 2023-08-25 MED ORDER — TRELEGY ELLIPTA 100-62.5-25 MCG/ACT IN AEPB: INHALATION_SPRAY | RESPIRATORY_TRACT | Status: DC

## 2023-08-25 NOTE — Telephone Encounter (Signed)
 Copied from CRM [id number]. Topic: Clinical - Prescription Issue >> Aug 25, 2023  9:27 AM Jasmin Berger wrote: Reason for CRM: Pt is calling again requesting the status of the medication sent to the pharmacy called Breztri . Pt stated the medication was on hold due to the insurance needing more information from the provider Dr. Neda. Pt stated there is a PA in place for the medication with no updates since 7/15. Please assist with the prescription and call the pt back today at [phone] ok to leave a vm. Pt also is requesting samples if any are available as she is without the medication for today.   I called and spoke pt. Pt states she spoke to the pharmacy and insurance is waiting to here back from the provider regarding any information regarding the Trelegy 100 and the switch to it. I will route to the [facility] location for them to advise if this has been received and also if someone can provide samples for the pt to pick up. Samples would need to be logged in the chart and sample log!

## 2023-08-25 NOTE — Telephone Encounter (Signed)
 Looks like pharmacy needs rx clarification from clinic. Routing to PA team to confirm about PA as well but someone from triage will please need to call pharmacy to confirm no clarification on directions, etc is needed

## 2023-08-25 NOTE — Telephone Encounter (Signed)
 Copied from CRM [id number]. Topic: Clinical - Prescription Issue >> Aug 24, 2023 10:23 AM Rilla NOVAK wrote: Reason for CRM: Patient states she is out of the sample of Trilogy that the office gave her.  She did call the pharmacy to pick up her order but the pharmacy said they needed more information from the office. Please contact pharmacy to update script. Patient states she is totally out of the sample and is in need. Please call patient and update.    Noted-   samples left at front desk for patient    NFN-

## 2023-08-25 NOTE — Telephone Encounter (Signed)
 Samples have been logged and placed up front. Nfn

## 2023-08-27 ENCOUNTER — Telehealth: Payer: Self-pay

## 2023-08-27 ENCOUNTER — Other Ambulatory Visit (HOSPITAL_COMMUNITY): Payer: Self-pay

## 2023-08-27 NOTE — Telephone Encounter (Signed)
 PA request has been Received. New Encounter has been or will be created for follow up. For additional info see Pharmacy Prior Auth telephone encounter from 07/25.

## 2023-08-27 NOTE — Telephone Encounter (Signed)
*  Pulm  Pharmacy Patient Advocate Encounter   Received notification from Pt Calls Messages that prior authorization for Trelegy Ellipta  100-62.5-25MCG/ACT aerosol powder  is required/requested.   Insurance verification completed.   The patient is insured through [facility] .   Per test claim: PA required; PA started via CoverMyMeds. KEY [id number] . Please see clinical question(s) below that I am not finding the answer to in their chart and advise.   Which two preferred inhaled corticosteroid combination medications has the member had an inadequate response to after a therapeutic trial with each?   -Advair Diskus -Advair HFA -Symbicort -Dulera

## 2023-08-30 MED ORDER — TRELEGY ELLIPTA 100-62.5-25 MCG/ACT IN AEPB: INHALATION_SPRAY | RESPIRATORY_TRACT | Status: DC

## 2023-08-31 ENCOUNTER — Ambulatory Visit
Admission: RE | Admit: 2023-08-31 | Discharge: 2023-08-31 | Disposition: A | Discharge status: Home / Self Care | Source: Ambulatory Visit | Attending: Family Medicine

## 2023-08-31 DIAGNOSIS — Z1231 Encounter for screening mammogram for malignant neoplasm of breast: Secondary | ICD-10-CM

## 2023-09-01 NOTE — Telephone Encounter (Signed)
 Neda Jennet LABOR, MD to Me     08/31/23  3:50 PM Patient tried anoro previously   If Trelegy will not be covered, we will place an order for Advair

## 2023-09-01 NOTE — Telephone Encounter (Signed)
 Miller, Juliana M, CPhT to Me     09/01/23 10:26 AM Anoro is not one of the preferred alternatives required for the patient to try.   Dr Neda, please advise on what strength advair you want pt to try, thanks!

## 2023-09-02 ENCOUNTER — Other Ambulatory Visit: Payer: Self-pay | Admitting: Pulmonary Disease

## 2023-09-02 MED ORDER — FLUTICASONE-SALMETEROL 250-50 MCG/ACT IN AEPB: 1.0000 | INHALATION_SPRAY | Freq: Two times a day (BID) | RESPIRATORY_TRACT | 5 refills | Status: DC

## 2023-09-02 NOTE — Progress Notes (Signed)
Advair ordered

## 2023-09-02 NOTE — Telephone Encounter (Signed)
 I called and spoke with pt and notified trelegy not approved so advair has been sent  She is aware of instructions for use  Nothing further needed

## 2023-09-03 ENCOUNTER — Telehealth: Payer: Self-pay

## 2023-09-03 ENCOUNTER — Other Ambulatory Visit (HOSPITAL_COMMUNITY): Payer: Self-pay

## 2023-09-03 NOTE — Telephone Encounter (Signed)
*  Pulm  Pharmacy Patient Advocate Encounter   Received notification from CoverMyMeds that prior authorization for Fluticasone -Salmeterol 250-50MCG/ACT aerosol powder  is required/requested.   Insurance verification completed.   The patient is insured through [facility] .   Per test claim:  Brand Advair Diksus is preferred by the insurance.  If suggested medication is appropriate, Please send in a new RX and discontinue this one. If not, please advise as to why it's not appropriate so that we may request a Prior Authorization. Please note, some preferred medications may still require a PA.  If the suggested medications have not been trialed and there are no contraindications to their use, the PA will not be submitted, as it will not be approved.   *called patient pharmacy to reprocess for Brand. They are ordering the med and it should be in Monday 08/04

## 2023-09-06 DIAGNOSIS — R0902 Hypoxemia: Secondary | ICD-10-CM | POA: Diagnosis not present

## 2023-09-06 DIAGNOSIS — R0602 Shortness of breath: Secondary | ICD-10-CM | POA: Diagnosis not present

## 2023-09-06 DIAGNOSIS — J449 Chronic obstructive pulmonary disease, unspecified: Secondary | ICD-10-CM | POA: Diagnosis not present

## 2023-09-13 DIAGNOSIS — H2512 Age-related nuclear cataract, left eye: Secondary | ICD-10-CM | POA: Diagnosis not present

## 2023-09-13 DIAGNOSIS — Z961 Presence of intraocular lens: Secondary | ICD-10-CM | POA: Diagnosis not present

## 2023-09-13 DIAGNOSIS — H25812 Combined forms of age-related cataract, left eye: Secondary | ICD-10-CM | POA: Diagnosis not present

## 2023-09-13 DIAGNOSIS — H25012 Cortical age-related cataract, left eye: Secondary | ICD-10-CM | POA: Diagnosis not present

## 2023-09-14 DIAGNOSIS — Z419 Encounter for procedure for purposes other than remedying health state, unspecified: Secondary | ICD-10-CM | POA: Diagnosis not present

## 2023-09-21 DIAGNOSIS — R0902 Hypoxemia: Secondary | ICD-10-CM | POA: Diagnosis not present

## 2023-09-21 DIAGNOSIS — J449 Chronic obstructive pulmonary disease, unspecified: Secondary | ICD-10-CM | POA: Diagnosis not present

## 2023-09-21 DIAGNOSIS — R0602 Shortness of breath: Secondary | ICD-10-CM | POA: Diagnosis not present

## 2023-10-07 DIAGNOSIS — J449 Chronic obstructive pulmonary disease, unspecified: Secondary | ICD-10-CM | POA: Diagnosis not present

## 2023-10-07 DIAGNOSIS — R0902 Hypoxemia: Secondary | ICD-10-CM | POA: Diagnosis not present

## 2023-10-07 DIAGNOSIS — R0602 Shortness of breath: Secondary | ICD-10-CM | POA: Diagnosis not present

## 2023-10-15 DIAGNOSIS — Z419 Encounter for procedure for purposes other than remedying health state, unspecified: Secondary | ICD-10-CM | POA: Diagnosis not present

## 2023-10-19 ENCOUNTER — Other Ambulatory Visit

## 2023-10-19 DIAGNOSIS — E611 Iron deficiency: Secondary | ICD-10-CM

## 2023-10-20 ENCOUNTER — Ambulatory Visit: Payer: Self-pay | Admitting: Family Medicine

## 2023-10-20 LAB — IRON,TIBC AND FERRITIN PANEL
Ferritin: 40 ng/mL (ref 15–150)
Iron Saturation: 23 % (ref 15–55)
Iron: 87 ug/dL (ref 27–159)
Total Iron Binding Capacity: 373 ug/dL (ref 250–450)
UIBC: 286 ug/dL (ref 131–425)

## 2023-10-22 DIAGNOSIS — R0902 Hypoxemia: Secondary | ICD-10-CM | POA: Diagnosis not present

## 2023-10-22 DIAGNOSIS — J449 Chronic obstructive pulmonary disease, unspecified: Secondary | ICD-10-CM | POA: Diagnosis not present

## 2023-10-22 DIAGNOSIS — R0602 Shortness of breath: Secondary | ICD-10-CM | POA: Diagnosis not present

## 2023-11-06 DIAGNOSIS — J449 Chronic obstructive pulmonary disease, unspecified: Secondary | ICD-10-CM | POA: Diagnosis not present

## 2023-11-06 DIAGNOSIS — R0602 Shortness of breath: Secondary | ICD-10-CM | POA: Diagnosis not present

## 2023-11-06 DIAGNOSIS — R0902 Hypoxemia: Secondary | ICD-10-CM | POA: Diagnosis not present

## 2023-11-21 DIAGNOSIS — R0902 Hypoxemia: Secondary | ICD-10-CM | POA: Diagnosis not present

## 2023-11-21 DIAGNOSIS — J449 Chronic obstructive pulmonary disease, unspecified: Secondary | ICD-10-CM | POA: Diagnosis not present

## 2023-11-21 DIAGNOSIS — R0602 Shortness of breath: Secondary | ICD-10-CM | POA: Diagnosis not present

## 2023-11-25 ENCOUNTER — Ambulatory Visit: Admitting: Pulmonary Disease

## 2023-11-25 ENCOUNTER — Ambulatory Visit

## 2023-11-25 ENCOUNTER — Encounter: Payer: Self-pay | Admitting: Pulmonary Disease

## 2023-11-25 VITALS — BP 139/84 | HR 114 | Temp 98.1°F | Ht 64.0 in | Wt 160.0 lb

## 2023-11-25 DIAGNOSIS — J441 Chronic obstructive pulmonary disease with (acute) exacerbation: Secondary | ICD-10-CM

## 2023-11-25 LAB — PULMONARY FUNCTION TEST
DL/VA % pred: 63 %
DL/VA: 2.7 ml/min/mmHg/L
DLCO unc % pred: 47 %
DLCO unc: 9.78 ml/min/mmHg
FEF 25-75 Post: 0.46 L/s
FEF 25-75 Pre: 0.36 L/s
FEF2575-%Change-Post: 30 %
FEF2575-%Pred-Post: 18 %
FEF2575-%Pred-Pre: 13 %
FEV1-%Change-Post: 10 %
FEV1-%Pred-Post: 37 %
FEV1-%Pred-Pre: 33 %
FEV1-Post: 1 L
FEV1-Pre: 0.91 L
FEV1FVC-%Change-Post: 1 %
FEV1FVC-%Pred-Pre: 59 %
FEV6-%Change-Post: 8 %
FEV6-%Pred-Post: 60 %
FEV6-%Pred-Pre: 55 %
FEV6-Post: 2 L
FEV6-Pre: 1.83 L
FEV6FVC-%Change-Post: 0 %
FEV6FVC-%Pred-Post: 99 %
FEV6FVC-%Pred-Pre: 98 %
FVC-%Change-Post: 8 %
FVC-%Pred-Post: 60 %
FVC-%Pred-Pre: 55 %
FVC-Post: 2.08 L
FVC-Pre: 1.91 L
Post FEV1/FVC ratio: 48 %
Post FEV6/FVC ratio: 96 %
Pre FEV1/FVC ratio: 47 %
Pre FEV6/FVC Ratio: 96 %
RV % pred: 217 %
RV: 4.11 L
TLC % pred: 119 %
TLC: 6.06 L

## 2023-11-25 MED ORDER — INCRUSE ELLIPTA 62.5 MCG/ACT IN AEPB: 1.0000 | INHALATION_SPRAY | Freq: Every day | RESPIRATORY_TRACT | 3 refills | Status: DC

## 2023-11-25 MED ORDER — FLUTICASONE-SALMETEROL 230-21 MCG/ACT IN AERO: 2.0000 | INHALATION_SPRAY | Freq: Two times a day (BID) | RESPIRATORY_TRACT | 5 refills | Status: DC

## 2023-11-25 NOTE — Patient Instructions (Signed)
 Full pft performed today

## 2023-11-25 NOTE — Progress Notes (Signed)
 Full pft performed today

## 2023-11-25 NOTE — Progress Notes (Signed)
 Jasmin Berger    [id number]    Sep 15, 1970  Primary Care Physician:Olson, Toribio POUR, MD  Referring Physician: Chandra Toribio POUR, MD [address] [place],  [place] [address]  Chief complaint:   Follow-up for obstructive lung disease  HPI:  Remains very short of breath Having a lot of symptoms  Finally Acticort working and considering disability  She is using Breztri , uses albuterol  as needed  Activity tolerance is very low  Recent CT scan significant for emphysema Pulmonary function test shows moderate obstructive disease with severe reduction in diffusing capacity  50-pack-year smoking history  Wheezing or shortness of breath with a lot of activities  She still does use oxygen  at night - She does feel more short of breath at night sometimes and feels oxygen  supplementation does help  Worked housekeeping, worked as a IT consultant in the past  Outpatient Encounter Medications as of 11/25/2023  Medication Sig   albuterol  (VENTOLIN  HFA) 108 (90 Base) MCG/ACT inhaler Inhale 2 puffs into the lungs every 4 (four) hours as needed for wheezing or shortness of breath.   buPROPion  (WELLBUTRIN  SR) 150 MG 12 hr tablet TAKE 1 TABLET BY MOUTH TWICE DAILY   fluticasone -salmeterol (ADVAIR) 250-50 MCG/ACT AEPB Inhale 1 puff into the lungs every 12 (twelve) hours.   gabapentin  (NEURONTIN ) 300 MG capsule Take 1 capsule (300 mg total) by mouth at bedtime.   Iron , Ferrous Sulfate , 325 (65 Fe) MG TABS Take 325 mg by mouth every other day.   Loratadine (CLARITIN) 10 MG CAPS Take by mouth.   Multiple Vitamin (MULTIVITAMIN) tablet Take 1 tablet by mouth daily.   polyethylene glycol powder (GLYCOLAX/MIRALAX) 17 GM/SCOOP powder Take 1 Container by mouth once.   vitamin B-12 (CYANOCOBALAMIN ) 100 MCG tablet Take 100 mcg by mouth daily.   vitamin E 180 MG (400 UNITS) capsule Take 400 Units by mouth daily.   budesonide-glycopyrrolate-formoterol (BREZTRI  AEROSPHERE) 160-9-4.8 MCG/ACT AERO  inhaler Inhale 2 puffs into the lungs in the morning and at bedtime. (Patient not taking: Reported on 11/25/2023)   budesonide-glycopyrrolate-formoterol (BREZTRI  AEROSPHERE) 160-9-4.8 MCG/ACT AERO inhaler 4 samples (Patient not taking: Reported on 11/25/2023)   budesonide-glycopyrrolate-formoterol (BREZTRI  AEROSPHERE) 160-9-4.8 MCG/ACT AERO inhaler 1 sample (Patient not taking: Reported on 11/25/2023)   Difluprednate 0.05 % EMUL  (Patient not taking: Reported on 11/25/2023)   Fluticasone -Umeclidin-Vilant (TRELEGY ELLIPTA ) 100-62.5-25 MCG/ACT AEPB Inhale 1 puff into the lungs daily. (Patient not taking: Reported on 11/25/2023)   Fluticasone -Umeclidin-Vilant (TRELEGY ELLIPTA ) 100-62.5-25 MCG/ACT AEPB 2 samples provided (Patient not taking: Reported on 11/25/2023)   Fluticasone -Umeclidin-Vilant (TRELEGY ELLIPTA ) 100-62.5-25 MCG/ACT AEPB 2 samples (Patient not taking: Reported on 11/25/2023)   No facility-administered encounter medications on file as of 11/25/2023.    Allergies as of 11/25/2023 - Review Complete 11/25/2023  Allergen Reaction Noted   Latex Rash 06/19/2022    Past Medical History:  Diagnosis Date   Arthritis    COPD (chronic obstructive pulmonary disease) (HCC)    TESTing in 8/24 to verify   Emphysema of lung (HCC)    Testing 8/24 to verify   Heart murmur    Mitral valve prolapse    Oxygen  deficiency    Uses at night  1 liter    Past Surgical History:  Procedure Laterality Date   ABDOMINAL HYSTERECTOMY     ADENOIDECTOMY     BREAST BIOPSY Right 08/03/2022   US  RT BREAST BX W LOC DEV 1ST LESION IMG BX SPEC US  GUIDE 08/03/2022 [facility]  CESAREAN SECTION     TYMPANOSTOMY TUBE PLACEMENT      Family History  Problem Relation Age of Onset   Colon polyps Mother    Crohn's disease Mother    Breast cancer Sister    Colon polyps Maternal Aunt    Crohn's disease Daughter    Colon cancer Neg Hx    Esophageal cancer Neg Hx    Rectal cancer Neg Hx    Stomach cancer  Neg Hx     Social History   Socioeconomic History   Marital status: Single    Spouse name: Not on file   Number of children: Not on file   Years of education: Not on file   Highest education level: Not on file  Occupational History   Not on file  Tobacco Use   Smoking status: Former    Current packs/day: 1.00    Types: Cigarettes   Smokeless tobacco: Never   Tobacco comments:    Pt states she quit 05/21/22  Vaping Use   Vaping status: Never Used  Substance and Sexual Activity   Alcohol use: Yes   Drug use: No   Sexual activity: Not Currently    Birth control/protection: Surgical  Other Topics Concern   Not on file  Social History Narrative   Not on file   Social Drivers of Health   Financial Resource Strain: Not on file  Food Insecurity: Not on file  Transportation Needs: Not on file  Physical Activity: Not on file  Stress: Not on file  Social Connections: Not on file  Intimate Partner Violence: Not on file    Review of Systems  Constitutional:  Negative for fatigue.  Respiratory:  Positive for cough and shortness of breath.     Vitals:   11/25/23 0940  BP: 139/84  Pulse: (!) 114  Temp: 98.1 F (36.7 C)  SpO2: 98%     Physical Exam Constitutional:      Appearance: Normal appearance.  HENT:     Head: Normocephalic.     Mouth/Throat:     Mouth: Mucous membranes are moist.  Eyes:     General: No scleral icterus. Cardiovascular:     Rate and Rhythm: Normal rate and regular rhythm.     Heart sounds: No murmur heard.    No friction rub.  Pulmonary:     Effort: No respiratory distress.     Breath sounds: No stridor. No wheezing or rhonchi.     Comments: Decreased air movement bilaterally Musculoskeletal:     Cervical back: No rigidity or tenderness.  Neurological:     Mental Status: She is alert.  Psychiatric:        Mood and Affect: Mood normal.    Data Reviewed: Recent chest x-ray reviewed -No acute infiltrative process, hyperinflated  lung fields  CT scan reviewed showing emphysema  PFT with moderate obstructive disease with severe reduction in diffusing capacity  Pulmonary function test performed 11/22/2023 reviewed with the patient showing severe obstructive disease with FEV1 percentage of 37% decreased from 49% a year ago DLCO 47%  Assessment:  Severe obstructive pulmonary disease with severe limitation in performance level  Extensive emphysema on CT  Hypoxemic respiratory failure  Decline in spirometry over the last year   Plan/Recommendations:  Continue Breztri   Continue bronchodilator treatments  Graded activities as tolerated Risk of progressive decline was discussed with the patient  She will bring in some disability forms once she applies for disability  Disability placard was filled out  today  Encouraged to call with significant concerns  I spent 32 minutes Pre-charting, reviewing records, interviewing/examining patient, and managing orders.  Jennet Epley MD [facility] 11/25/2023, 10:12 AM  CC: Chandra Toribio POUR, MD

## 2023-11-25 NOTE — Patient Instructions (Signed)
 Prescription for incruse  Prescription for advair HFA in place of discus  Disability application- bring the form in once application started  Disability placard  Follow up in 3 months

## 2023-12-01 ENCOUNTER — Telehealth: Payer: Self-pay

## 2023-12-01 NOTE — Telephone Encounter (Signed)
 Copied from CRM [id number]. Topic: Clinical - Medication Question >> Nov 30, 2023  4:14 PM Rozanna MATSU wrote: Reason for CRM: pt stated she is needing a letter for her food stamps stating is not able to work and will need this by 11/01 and would like to know if someone can get this done for her and send it to them. Stated she can come and pick it up if possible. Stated Dr Neda was the one declared her disable.

## 2023-12-02 ENCOUNTER — Telehealth: Payer: Self-pay

## 2023-12-02 NOTE — Telephone Encounter (Signed)
 Copied from CRM #[id number]. Topic: Clinical - Medication Prior Auth >> Dec 02, 2023 11:21 AM Leila BROCKS wrote: Reason for CRM: Patient [phone] states Dr. Neda sent two prescriptions, but only received umeclidinium bromide (INCRUSE ELLIPTA) 62.5 MCG/ACT AEPB. [facility] states PA is needed and sent to the office for fluticasone -salmeterol (ADVAIR HFA) 230-21 MCG/ACT inhaler. Patient states is not having any symptoms right, has oxygen  concentrator. Patient is checking on the PA. Please advise and call back.   [facility] - [place], [place] - [address], [place] [address] [phone]:[phone]

## 2023-12-03 ENCOUNTER — Other Ambulatory Visit (HOSPITAL_COMMUNITY): Payer: Self-pay

## 2023-12-03 NOTE — Telephone Encounter (Signed)
 Spoke with pharmacy staff and notified also left pt message on VM to notify

## 2023-12-07 ENCOUNTER — Encounter: Payer: Self-pay | Admitting: *Deleted

## 2023-12-07 DIAGNOSIS — R0902 Hypoxemia: Secondary | ICD-10-CM | POA: Diagnosis not present

## 2023-12-07 DIAGNOSIS — J449 Chronic obstructive pulmonary disease, unspecified: Secondary | ICD-10-CM | POA: Diagnosis not present

## 2023-12-07 DIAGNOSIS — R0602 Shortness of breath: Secondary | ICD-10-CM | POA: Diagnosis not present

## 2023-12-07 NOTE — Telephone Encounter (Signed)
 I called and spoke with patient, I let her know that Dr. Neda will be in the office on Friday and he can sign the letter he has written for her.  I let her know I will go ahead and print it out so he can sign it first thing in the morning and then we can call her and let her know it is ready so she can pick it up.  I let her know we will leave it up front for her to pick up.  She verified understanding and was very appreciative.  Letter printed and placed in his sign folder.  Please call patient when it is ready for pick up.

## 2023-12-07 NOTE — Telephone Encounter (Signed)
 I have created a letter in her chart, pended  This can be printed for her  I should be in the office on Friday if anything else is needed

## 2023-12-13 NOTE — Telephone Encounter (Signed)
 Letter signed by Dr. Neda.  Patient called and made aware that letter is ready for pick up.  Nothing further needed.

## 2023-12-22 DIAGNOSIS — R0902 Hypoxemia: Secondary | ICD-10-CM | POA: Diagnosis not present

## 2023-12-22 DIAGNOSIS — R0602 Shortness of breath: Secondary | ICD-10-CM | POA: Diagnosis not present

## 2023-12-22 DIAGNOSIS — J449 Chronic obstructive pulmonary disease, unspecified: Secondary | ICD-10-CM | POA: Diagnosis not present

## 2023-12-23 ENCOUNTER — Encounter: Payer: Self-pay | Admitting: Family Medicine

## 2023-12-23 ENCOUNTER — Ambulatory Visit (INDEPENDENT_AMBULATORY_CARE_PROVIDER_SITE_OTHER): Admitting: Family Medicine

## 2023-12-23 VITALS — BP 133/83 | HR 106 | Ht 64.0 in | Wt 160.8 lb

## 2023-12-23 DIAGNOSIS — Z23 Encounter for immunization: Secondary | ICD-10-CM

## 2023-12-23 DIAGNOSIS — G2581 Restless legs syndrome: Secondary | ICD-10-CM

## 2023-12-23 DIAGNOSIS — F418 Other specified anxiety disorders: Secondary | ICD-10-CM | POA: Diagnosis not present

## 2023-12-23 DIAGNOSIS — E678 Other specified hyperalimentation: Secondary | ICD-10-CM

## 2023-12-23 DIAGNOSIS — R292 Abnormal reflex: Secondary | ICD-10-CM

## 2023-12-23 DIAGNOSIS — R29898 Other symptoms and signs involving the musculoskeletal system: Secondary | ICD-10-CM | POA: Diagnosis not present

## 2023-12-23 DIAGNOSIS — G252 Other specified forms of tremor: Secondary | ICD-10-CM | POA: Diagnosis not present

## 2023-12-23 MED ORDER — GABAPENTIN 400 MG PO TABS: 400.0000 mg | ORAL_TABLET | Freq: Every day | ORAL | 2 refills | Status: AC

## 2023-12-23 MED ORDER — PROPRANOLOL HCL ER 60 MG PO CP24: 60.0000 mg | ORAL_CAPSULE | Freq: Every day | ORAL | 2 refills | Status: DC

## 2023-12-23 NOTE — Assessment & Plan Note (Signed)
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 Symptoms are suggestive of a possible unilateral radiculopathy, spinal stenosis, or a central nervous system process. Differential also includes essential tremor. Unlikely to be Parkinson's disease due to the absence of a resting tremor. - Plan X-ray of lumbar spine. - Plan referral to Physical Medicine and Rehab for EMG/NCS of the right lower extremity. - Will consider MRI of the brain and lumbar spine based on initial workup results. - Pending results, will determine need for referral to Neurology or Neurosurgery. - Plan labs: TSH, methylmalonic acid.

## 2023-12-23 NOTE — Assessment & Plan Note (Signed)
 Pt w/ 4 years of complaint of weakness in the right leg with an associated isometric tremor most notably with plantar flexion and dorsiflexon (S1) of the right side.  Also has hyperreflexia of both patellar tendon reflexes and brachioradialis reflexes.  Getting labs, lumbar xray followed likely by lumbar mri, and emg/ncs.  Will get mri brain when we order lumbar mri.

## 2023-12-23 NOTE — Assessment & Plan Note (Signed)
-   Reports waking after 3-4 hours of sleep and RLS symptoms at night, despite taking gabapentin . - Increase gabapentin  to 400mg  at night.

## 2023-12-23 NOTE — Assessment & Plan Note (Signed)
-   Reports anxiety primarily related to driving. - Plan to start propranolol  to help with both anxiety and kinetic tremor. - Start Propranolol  LA once daily in the morning.

## 2023-12-23 NOTE — Patient Instructions (Signed)
 It was nice to see you today,  We addressed the following topics today: - I have ordered an X-ray of your low back. You can go to [facility] to have this done as a walk-in. - I am also ordering some blood tests  - I have sent a prescription for propranolol long-acting. Take this once a day in the morning. When you take it for the first time, please wait a couple of hours to see how you feel before you drive. - I have increased your gabapentin  dose to 400mg . You can hold on to your 300mg  capsules if you have a lot left. If the 400mg  does not help your sleep, please message me and I can increase it to 600mg . - Please keep your appointment in January.  Have a great day,  Rolan Slain, MD

## 2023-12-23 NOTE — Progress Notes (Signed)
 Established Patient Office Visit  Subjective   Patient ID: Jasmin Berger, female    DOB: [birth year]  Age: 53 y.o. MRN: [id number]  Chief Complaint  Patient presents with   Leg Problem    HPI  Subjective - Right leg issues for approximately 4 years. Describes it as dragging and not focusing right at times, with a feeling of lack of control. Worsens later in the day. No pain, numbness, tingling, or burning. Reports recent back pain when standing for long periods, like washing dishes. Denies history of back surgery, urinary incontinence, or groin numbness. Reports right leg feels weaker, especially when trying to stand up from a squatting position. - Right leg shaking when exerting force on it, noticed for about 4 years. Initially noticed when driving. Also occurs when bending over. Describes it as the whole leg shaking. Shaking is controllable if pushing down on the leg, such as on the gas pedal. Denies shaking when at rest (e.g., lying in bed or sitting). - Hand shaking for about 4 years. Occurs with action, such as trying to use them. Has difficulty holding a coffee cup, requires two hands. - Anxiety, primarily when driving or riding with others. Tries to drive when no other cars are around. - Poor sleep. Reports sleeping only 3-4 hours per night. Wakes up at 2:00 or 3:00 AM and is unable to fall back asleep. Restless legs also wake her at night.  Medications Current medications include albuterol , fluticasone , Advair, Breo Ellipta, vitamin E, iron , gabapentin  300mg  at night, and bupropion .  PMH, PSH, FH, Social Hx PMHx: Neuropathy in both legs (previously treated with therapy), back pain, anxiety, restless legs. PSH: No back surgery. Eye surgery in the past. Social Hx: Quit driving 4 years ago due to vision and leg shaking, now trying to drive again after eye surgery.  ROS Constitutional: Denies pain associated with leg symptoms. Neurological: Positive for right leg weakness,  dragging sensation, and kinetic tremor in right leg and both hands. Denies resting tremor. Denies numbness, tingling, burning, groin numbness. Positive for back pain. GU: Denies urinary incontinence. Psychiatric: Positive for situational anxiety (driving). Positive for insomnia/early morning awakening.   The 10-year ASCVD risk score (Arnett DK, et al., 2019) is: 2%  Health Maintenance Due  Topic Date Due   HIV Screening  Never done   Hepatitis C Screening  Never done   Hepatitis B Vaccines 19-59 Average Risk (1 of 3 - 19+ 3-dose series) Never done   COVID-19 Vaccine (1 - 2025-26 season) Never done      Objective:     BP 133/83   Pulse (!) 106   Ht 5' 4 (1.626 m)   Wt 160 lb 12.8 oz (72.9 kg)   SpO2 95%   BMI 27.60 kg/m    Physical Exam Gen: alert, oriented Pulm: no respiratory distress NEURO: Kinetic tremor noted in outstretched hands.No pronator drift observed. Plantar flexion on the right demonstrates a clonic-like tremor when pushing against resistance. Weakness with clonus noted with sustained right plantar flexion, and right knee flexion.  Hyperreflexia in the brachioradialis and patellar tendon reflex.   Psych: pleasant affect   No results found for any visits on 12/23/23.      Assessment & Plan:   Kinetic tremor Assessment & Plan: - 4-year history of right leg dragging, weakness, and kinetic tremor. Also has a kinetic tremor in both hands. Symptoms are suggestive of a possible unilateral radiculopathy, spinal stenosis, or a central nervous system process. Differential also  includes essential tremor. Unlikely to be Parkinson's disease due to the absence of a resting tremor. - Plan X-ray of lumbar spine. - Plan referral to Physical Medicine and Rehab for EMG/NCS of the right lower extremity. - Will consider MRI of the brain and lumbar spine based on initial workup results. - Pending results, will determine need for referral to Neurology or Neurosurgery. - Plan  labs: TSH, methylmalonic acid.  Orders: -     B12 and Folate Panel -     Methylmalonic acid, serum -     TSH -     Ambulatory referral to Physical Medicine Rehab -     RPR -     HIV Antibody (routine testing w rflx) -     Vitamin E  Right leg weakness Assessment & Plan: Pt w/ 4 years of complaint of weakness in the right leg with an associated isometric tremor most notably with plantar flexion and dorsiflexon (S1) of the right side.  Also has hyperreflexia of both patellar tendon reflexes and brachioradialis reflexes.  Getting labs, lumbar xray followed likely by lumbar mri, and emg/ncs.  Will get mri brain when we order lumbar mri.    Orders: -     DG Lumbar Spine 2-3 Views; Future -     Ambulatory referral to Physical Medicine Rehab  Immunization due -     Flu vaccine trivalent PF, 6mos and older(Flulaval,Afluria,Fluarix,Fluzone)  Situational anxiety Assessment & Plan: - Reports anxiety primarily related to driving. - Plan to start propranolol  to help with both anxiety and kinetic tremor. - Start Propranolol  LA once daily in the morning.   RLS (restless legs syndrome) Assessment & Plan: - Reports waking after 3-4 hours of sleep and RLS symptoms at night, despite taking gabapentin . - Increase gabapentin  to 400mg  at night.   Excessive intake of vitamin E or vitamin E derivative -     Vitamin E  Hyperreflexia -     B12 and Folate Panel -     Methylmalonic acid, serum -     TSH -     Ambulatory referral to Physical Medicine Rehab -     RPR -     HIV Antibody (routine testing w rflx) -     Vitamin E  Other orders -     Gabapentin ; Take 1 tablet (400 mg total) by mouth at bedtime.  Dispense: 30 tablet; Refill: 2 -     Propranolol  HCl ER; Take 1 capsule (60 mg total) by mouth daily.  Dispense: 30 capsule; Refill: 2     Return for already has appt.    Toribio MARLA Slain, MD

## 2023-12-28 ENCOUNTER — Other Ambulatory Visit

## 2023-12-28 DIAGNOSIS — E678 Other specified hyperalimentation: Secondary | ICD-10-CM | POA: Diagnosis not present

## 2023-12-28 DIAGNOSIS — G252 Other specified forms of tremor: Secondary | ICD-10-CM | POA: Diagnosis not present

## 2023-12-28 DIAGNOSIS — R292 Abnormal reflex: Secondary | ICD-10-CM | POA: Diagnosis not present

## 2023-12-29 ENCOUNTER — Ambulatory Visit
Admission: RE | Admit: 2023-12-29 | Discharge: 2023-12-29 | Disposition: A | Discharge status: Home / Self Care | Source: Ambulatory Visit | Attending: Family Medicine | Admitting: Family Medicine

## 2023-12-29 ENCOUNTER — Ambulatory Visit: Payer: Self-pay | Admitting: Family Medicine

## 2023-12-29 DIAGNOSIS — R7989 Other specified abnormal findings of blood chemistry: Secondary | ICD-10-CM

## 2023-12-29 DIAGNOSIS — M545 Low back pain, unspecified: Secondary | ICD-10-CM | POA: Diagnosis not present

## 2023-12-29 DIAGNOSIS — R29898 Other symptoms and signs involving the musculoskeletal system: Secondary | ICD-10-CM

## 2024-01-01 LAB — HIV ANTIBODY (ROUTINE TESTING W REFLEX): HIV Screen 4th Generation wRfx: NONREACTIVE

## 2024-01-01 LAB — TSH: TSH: 7.52 u[IU]/mL — ABNORMAL HIGH (ref 0.450–4.500)

## 2024-01-01 LAB — SYPHILIS: RPR W/REFLEX TO RPR TITER AND TREPONEMAL ANTIBODIES, TRADITIONAL SCREENING AND DIAGNOSIS ALGORITHM: RPR Ser Ql: NONREACTIVE

## 2024-01-01 LAB — METHYLMALONIC ACID, SERUM: Methylmalonic Acid: 241 nmol/L (ref 0–378)

## 2024-01-01 LAB — VITAMIN E
Vitamin E (Alpha Tocopherol): 18.4 mg/L (ref 7.0–25.1)
Vitamin E(Gamma Tocopherol): 0.2 mg/L — AB (ref 0.5–5.5)

## 2024-01-01 LAB — B12 AND FOLATE PANEL
Folate: >20 ng/mL (ref >3.0)
Vitamin B-12: >2000 pg/mL — ABNORMAL HIGH (ref 232–1245)

## 2024-01-05 ENCOUNTER — Other Ambulatory Visit: Payer: Self-pay | Admitting: Family Medicine

## 2024-01-05 DIAGNOSIS — R251 Tremor, unspecified: Secondary | ICD-10-CM

## 2024-01-05 DIAGNOSIS — R29898 Other symptoms and signs involving the musculoskeletal system: Secondary | ICD-10-CM

## 2024-01-06 DIAGNOSIS — J449 Chronic obstructive pulmonary disease, unspecified: Secondary | ICD-10-CM | POA: Diagnosis not present

## 2024-01-06 DIAGNOSIS — R0902 Hypoxemia: Secondary | ICD-10-CM | POA: Diagnosis not present

## 2024-01-14 ENCOUNTER — Encounter: Payer: Self-pay | Admitting: Physical Medicine & Rehabilitation

## 2024-01-17 ENCOUNTER — Telehealth: Payer: Self-pay

## 2024-01-17 NOTE — Telephone Encounter (Signed)
 Pt is requesting an increase on the propranolol  ER (INDERAL  LA) 60 MG 24 hr capsule   Also requesting to take for the nervousness getting her MRI done on Thursday/   Pharmacy:  [facility] - [place], [place] - [address]

## 2024-01-18 ENCOUNTER — Ambulatory Visit (HOSPITAL_COMMUNITY)

## 2024-01-18 ENCOUNTER — Telehealth: Payer: Self-pay

## 2024-01-18 ENCOUNTER — Other Ambulatory Visit: Payer: Self-pay | Admitting: Family Medicine

## 2024-01-18 MED ORDER — LORAZEPAM 1 MG PO TABS: ORAL_TABLET | ORAL | 0 refills | Status: AC

## 2024-01-18 MED ORDER — PROPRANOLOL HCL ER 80 MG PO CP24: 80.0000 mg | ORAL_CAPSULE | Freq: Every day | ORAL | 2 refills | Status: AC

## 2024-01-18 NOTE — Telephone Encounter (Signed)
 Called patient she is advised and agreeable to the recommendation

## 2024-01-18 NOTE — Telephone Encounter (Signed)
 Please take a look at this. We see that you have been working on this.

## 2024-01-18 NOTE — Telephone Encounter (Signed)
 Copied from CRM #[id number]. Topic: Referral - Prior Authorization Question >> Jan 18, 2024  9:56 AM Shanda MATSU wrote: Reason for CRM: Lolita w/[facility] called in regards to authorizations for MRI's patient has been ordered to have, caller stated MRI with CPT code 27851 there is no auth on file with the ins and MRI with CPT code 29448 there is an shara is on file, but the facility is not correct, is req for this to be corrected, please contact her back with a status.

## 2024-01-18 NOTE — Telephone Encounter (Signed)
 I have increased it to 80mg .  She should check her blood pressure as this medication does lower bp and if she feels dizzy or lightheaded or her blood pressure is < 100 she should let us  know.    I sent in a tablet of ativan  for her mri. S he should take it 2 hours before the mri.  Someone should drive her to and from the appointment if she is going to take this.

## 2024-01-19 NOTE — Telephone Encounter (Signed)
 Copied from CRM #[id number]. Topic: Referral - Prior Authorization Question >> Jan 19, 2024  9:24 AM Robinson DEL wrote: Lolita with [facility] Pre Authorization department following up on message sent regarding wrong facility cpt on order for MRI and patient is scheduled for tomorrow and needs this updated.  Lolita [phone] Ext [phone]

## 2024-01-20 ENCOUNTER — Ambulatory Visit (HOSPITAL_COMMUNITY)

## 2024-01-21 DIAGNOSIS — J449 Chronic obstructive pulmonary disease, unspecified: Secondary | ICD-10-CM | POA: Diagnosis not present

## 2024-01-21 DIAGNOSIS — R0902 Hypoxemia: Secondary | ICD-10-CM | POA: Diagnosis not present

## 2024-01-24 ENCOUNTER — Telehealth: Payer: Self-pay | Admitting: *Deleted

## 2024-01-24 NOTE — Telephone Encounter (Signed)
 Contacted pt and she said that the MRI were approved at a different location, it was supposed to be [facility] and in the referral one was for [facility] or [facility] and the other [facility], I have sent a message to the referral team to check on the status and to be sure she get them approved for [name], pt has appointment on 02/04/24

## 2024-01-24 NOTE — Telephone Encounter (Signed)
 Copied from CRM [id number]. Topic: General - Call Back - No Documentation >> Jan 24, 2024  3:19 PM Olam RAMAN wrote: Reason for CRM: pt calling they called her from [facility] clinic that they sent pt to wrong location. Is supposed to go to [facility] and had wrong location.  CB for pt:  [phone]

## 2024-01-25 ENCOUNTER — Ambulatory Visit (HOSPITAL_COMMUNITY)

## 2024-02-02 ENCOUNTER — Telehealth: Payer: Self-pay | Admitting: *Deleted

## 2024-02-02 NOTE — Telephone Encounter (Signed)
 Copied from CRM #[id number]. Topic: Referral - Prior Authorization Question >> Jan 18, 2024  9:56 AM Shanda MATSU wrote: Reason for CRM: Lolita w/[facility] called in regards to authorizations for MRI's patient has been ordered to have, caller stated MRI with CPT code 27851 there is no auth on file with the ins and MRI with CPT code 29448 there is an shara is on file, but the facility is not correct, is req for this to be corrected, please contact her back with a status. >> Feb 02, 2024 12:45 PM Amy B wrote: Lolita with [facility] Pre Authorization department is following up on a prior authorization request for MRI that is scheduled for tomorrow.  They are going to cancel the appointment due to no authorization.  This is the 4th time they have had to cancel.   Please reach out to the patient once authorization has been obtained.   >> Jan 19, 2024  9:24 AM Robinson DEL wrote: Lolita with [facility] Pre Authorization department following up on message sent regarding wrong facility cpt on order for MRI and patient is scheduled for tomorrow and needs this updated.  Lolita [phone] Ext [phone]

## 2024-02-04 ENCOUNTER — Ambulatory Visit (HOSPITAL_COMMUNITY)

## 2024-02-10 ENCOUNTER — Other Ambulatory Visit

## 2024-02-10 DIAGNOSIS — R7989 Other specified abnormal findings of blood chemistry: Secondary | ICD-10-CM

## 2024-02-11 ENCOUNTER — Encounter: Payer: Self-pay | Admitting: Pulmonary Disease

## 2024-02-11 ENCOUNTER — Encounter (HOSPITAL_BASED_OUTPATIENT_CLINIC_OR_DEPARTMENT_OTHER): Payer: Self-pay

## 2024-02-11 ENCOUNTER — Ambulatory Visit: Admitting: Pulmonary Disease

## 2024-02-11 VITALS — BP 137/65 | HR 75 | Ht 64.0 in | Wt 164.6 lb

## 2024-02-11 DIAGNOSIS — J9691 Respiratory failure, unspecified with hypoxia: Secondary | ICD-10-CM

## 2024-02-11 DIAGNOSIS — Z87891 Personal history of nicotine dependence: Secondary | ICD-10-CM | POA: Diagnosis not present

## 2024-02-11 DIAGNOSIS — J449 Chronic obstructive pulmonary disease, unspecified: Secondary | ICD-10-CM | POA: Diagnosis not present

## 2024-02-11 DIAGNOSIS — J439 Emphysema, unspecified: Secondary | ICD-10-CM | POA: Diagnosis not present

## 2024-02-11 DIAGNOSIS — J431 Panlobular emphysema: Secondary | ICD-10-CM

## 2024-02-11 DIAGNOSIS — J441 Chronic obstructive pulmonary disease with (acute) exacerbation: Secondary | ICD-10-CM

## 2024-02-11 MED ORDER — FLUTICASONE-SALMETEROL 230-21 MCG/ACT IN AERO: 2.0000 | INHALATION_SPRAY | Freq: Two times a day (BID) | RESPIRATORY_TRACT | 5 refills | Status: AC

## 2024-02-11 MED ORDER — INCRUSE ELLIPTA 62.5 MCG/ACT IN AEPB: 1.0000 | INHALATION_SPRAY | Freq: Every day | RESPIRATORY_TRACT | 3 refills | Status: AC

## 2024-02-11 NOTE — Progress Notes (Signed)
 "              Jasmin Berger    [id number]    [birth year]  Primary Care Physician:Olson, Toribio POUR, MD  Referring Physician: Chandra Toribio POUR, MD [address] [place],  [place] [address]  Chief complaint:   Follow-up for obstructive lung disease  HPI:  Shortness of breath with minimal activities Will get short of breath with less than a block of walking  Shopping is becoming more limiting  She is currently disabled  Currently uses Advair and Incruse  Extensive emphysema, PFT with severe obstructive disease with severe reduction in diffusing capacity with significant progression over the last year  She has a 50-pack-year smoking history  She still does use oxygen  at night - She does feel more short of breath at night sometimes and feels oxygen  supplementation does help  Worked housekeeping, worked as a it consultant in the past  Outpatient Encounter Medications as of 02/11/2024  Medication Sig   albuterol  (VENTOLIN  HFA) 108 (90 Base) MCG/ACT inhaler Inhale 2 puffs into the lungs every 4 (four) hours as needed for wheezing or shortness of breath.   buPROPion  (WELLBUTRIN  SR) 150 MG 12 hr tablet TAKE 1 TABLET BY MOUTH TWICE DAILY   Difluprednate 0.05 % EMUL    fluticasone -salmeterol (ADVAIR HFA) 230-21 MCG/ACT inhaler Inhale 2 puffs into the lungs 2 (two) times daily.   fluticasone -salmeterol (ADVAIR) 250-50 MCG/ACT AEPB Inhale 1 puff into the lungs every 12 (twelve) hours.   gabapentin  (NEURONTIN ) 400 MG tablet Take 1 tablet (400 mg total) by mouth at bedtime.   Iron , Ferrous Sulfate , 325 (65 Fe) MG TABS Take 325 mg by mouth every other day.   Loratadine (CLARITIN) 10 MG CAPS Take by mouth.   LORazepam  (ATIVAN ) 1 MG tablet Take 1 tablet orally 2 hours before MRI   Multiple Vitamin (MULTIVITAMIN) tablet Take 1 tablet by mouth daily.   polyethylene glycol powder (GLYCOLAX/MIRALAX) 17 GM/SCOOP powder Take 1 Container by mouth once.   propranolol  ER (INDERAL  LA) 80 MG 24 hr capsule Take  1 capsule (80 mg total) by mouth daily.   umeclidinium bromide  (INCRUSE ELLIPTA ) 62.5 MCG/ACT AEPB Inhale 1 puff into the lungs daily.   vitamin B-12 (CYANOCOBALAMIN ) 100 MCG tablet Take 100 mcg by mouth daily.   vitamin E 180 MG (400 UNITS) capsule Take 400 Units by mouth daily.   No facility-administered encounter medications on file as of 02/11/2024.    Allergies as of 02/11/2024 - Review Complete 02/11/2024  Allergen Reaction Noted   Latex Rash 06/19/2022    Past Medical History:  Diagnosis Date   Arthritis    COPD (chronic obstructive pulmonary disease) (HCC)    TESTing in 8/24 to verify   Emphysema of lung (HCC)    Testing 8/24 to verify   Heart murmur    Mitral valve prolapse    Oxygen  deficiency    Uses at night  1 liter    Past Surgical History:  Procedure Laterality Date   ABDOMINAL HYSTERECTOMY     ADENOIDECTOMY     BREAST BIOPSY Right 08/03/2022   US  RT BREAST BX W LOC DEV 1ST LESION IMG BX SPEC US  GUIDE 08/03/2022 [facility] MAMMOGRAPHY   CESAREAN SECTION     TYMPANOSTOMY TUBE PLACEMENT      Family History  Problem Relation Age of Onset   Colon polyps Mother    Crohn's disease Mother    Breast cancer Sister    Colon polyps Maternal Aunt  Crohn's disease Daughter    Colon cancer Neg Hx    Esophageal cancer Neg Hx    Rectal cancer Neg Hx    Stomach cancer Neg Hx     Social History   Socioeconomic History   Marital status: Single    Spouse name: Not on file   Number of children: Not on file   Years of education: Not on file   Highest education level: Not on file  Occupational History   Not on file  Tobacco Use   Smoking status: Former    Current packs/day: 1.00    Types: Cigarettes   Smokeless tobacco: Never   Tobacco comments:    Pt states she quit 05/21/22  Vaping Use   Vaping status: Never Used  Substance and Sexual Activity   Alcohol use: Yes   Drug use: No   Sexual activity: Not Currently    Birth control/protection: Surgical  Other  Topics Concern   Not on file  Social History Narrative   Not on file   Social Drivers of Health   Tobacco Use: Medium Risk (02/11/2024)   Patient History    Smoking Tobacco Use: Former    Smokeless Tobacco Use: Never    Passive Exposure: Not on Actuary Strain: Not on file  Food Insecurity: Not on file  Transportation Needs: Not on file  Physical Activity: Not on file  Stress: Not on file  Social Connections: Not on file  Intimate Partner Violence: Not on file  Depression (PHQ2-9): Low Risk (12/23/2023)   Depression (PHQ2-9)    PHQ-2 Score: 2  Alcohol Screen: Not on file  Housing: Not on file  Utilities: Not on file  Health Literacy: Not on file    Review of Systems  Constitutional:  Negative for fatigue.  Respiratory:  Positive for cough and shortness of breath.     Vitals:   02/11/24 0910  BP: 137/65  Pulse: 75  SpO2: 95%     Physical Exam Constitutional:      Appearance: Normal appearance.  HENT:     Head: Normocephalic.     Mouth/Throat:     Mouth: Mucous membranes are moist.  Eyes:     General: No scleral icterus.    Pupils: Pupils are equal, round, and reactive to light.  Cardiovascular:     Rate and Rhythm: Normal rate and regular rhythm.     Heart sounds: No murmur heard.    No friction rub.  Pulmonary:     Effort: No respiratory distress.     Breath sounds: No stridor. No wheezing or rhonchi.     Comments: Decreased air movement bilaterally Musculoskeletal:     Cervical back: No rigidity or tenderness.  Skin:    General: Skin is warm.  Neurological:     General: No focal deficit present.     Mental Status: She is alert.  Psychiatric:        Mood and Affect: Mood normal.    Data Reviewed: Recent chest x-ray reviewed with no infiltrative process  CT scan reviewed showing emphysema  PFT with severe obstructive disease  Compared with PFT a year ago, there is progression  Assessment:  Severe obstructive pulmonary  disease with severe limitations in performance level  Extensive emphysema on CT  Hypoxemic respiratory failure  More symptoms as well significant decline in spirometric values over the last year   Plan/Recommendations:  Continue Advair and Incruse  Referral for pulmonary rehab  Referral for lung transplant  evaluation  Evaluate for need for oxygen  during the day  I spent 32 minutes dedicated to the care of this patient on the date of this encounter to include previsit review of records, face-to-face time with the patient discussing conditions above, post visit ordering of testing,ordering medications,independentlyinterpreting results, clinical documentation with electronic health record and communicated necessary findings to members of the patient's care team  Jennet Epley MD [facility] 02/11/2024, 9:41 AM  CC: Chandra Toribio POUR, MD  Armand Burnard SAUNDERS, CMA [place] Feb 11, 2024 10:13 AM   SATURATION QUALIFICATIONS: (This note is used to comply with regulatory documentation for home oxygen )   Patient Saturations on Room Air at Rest = 94%   Patient Saturations on Room Air while Ambulating = 85%   Patient Saturations on 2 Liters of oxygen  while Ambulating = 96%   Please briefly explain why patient needs home oxygen :patient could only do a lap and half before O2 dropped to 85 % placed on POC 2L sats @ 96      DME: [employer]  1.5 L Tank O2  02/11/2024 "

## 2024-02-11 NOTE — Patient Instructions (Addendum)
 Gold 3 COPD Continue your current inhalers Advair and Incruse  We will walk around today to check your oxygen  levels  Referral for lung transplant evaluation  Pulmonary rehab referral  Follow-up in 3 months  Make sure you are exercising on a regular basis  Call with significant concerns

## 2024-02-11 NOTE — Addendum Note (Signed)
 Addended by: ARMAND SOR R on: 02/11/2024 10:24 AM   Modules accepted: Orders

## 2024-02-16 ENCOUNTER — Encounter (HOSPITAL_COMMUNITY): Payer: Self-pay

## 2024-02-16 ENCOUNTER — Telehealth (HOSPITAL_COMMUNITY): Payer: Self-pay

## 2024-02-16 NOTE — Telephone Encounter (Signed)
 Called patient to see if she was interested in participating in the Pulmonary Rehab Program. Patient will come in for orientation on 1/23@1030  and will attend the 10:15 exercise class.  Sent package.

## 2024-02-16 NOTE — Telephone Encounter (Signed)
 Pt insurance is active and benefits verified through [facility] Co-pay $4, DED 0/0 met, out of pocket 0/0 met, co-insurance 0%. no pre-authorization required, Angela/[employer] 02/16/24@, REF# [id number]

## 2024-02-17 ENCOUNTER — Ambulatory Visit: Payer: Self-pay | Admitting: Family Medicine

## 2024-02-21 LAB — TSH+T3+THYABS+TPO AB+TRAB+T...
Anti-Thyroglobulin Antibodies: <1 [IU]/mL
Anti-Thyroid Peroxidase Ab: <9 [IU]/mL
Free T-3: 2.7 pg/mL
Free T4 by Dialysis: 0.97 ng/dL
Reverse T3,  LCMS Endo Sci: 12 ng/dL
TSH Receptor Antibody (TBII): <0.3 U/L
TSH: 6.3 uU/mL — ABNORMAL HIGH
Triiodothyronine (T-3), Serum: 92 ng/dL

## 2024-02-23 ENCOUNTER — Telehealth: Payer: Self-pay | Admitting: *Deleted

## 2024-02-23 NOTE — Telephone Encounter (Signed)
 Copied from CRM [id number]. Topic: Clinical - Order For Equipment >> Feb 21, 2024  1:34 PM Corean R wrote: Reason for CRM: Patient states she was advised by [employer]  that in order to get a POC with them she has to be on a wait list but patient states she can't do that as she is starting pulmonary rehab and needs the POC.  Patient is requesting a call back from Dr. Stevan nurse to discuss options with other DME companies. >> Feb 23, 2024  9:15 AM Leila BROCKS wrote: Patient [phone] is checking on portable oxygen  status, has not had a called back and is asking for a call back today. Patient uses [employer], but they do not have it, patient will be on a wait list. Patient is asking for a different DME company. Patient has physical therapy next Friday 03/02/24 and cannot go for the appointment without portable oxygen  due to her oxygen  level goes down  to 85. Please advise and call back.   ----------------------------------------------------------------------------------------------------------------------------------------------  Patient is getting ready to start pulmonary rehab and needs a POC.  She is on a wait list with [employer].  Is there another DME company she can use?

## 2024-02-24 ENCOUNTER — Telehealth (HOSPITAL_COMMUNITY): Payer: Self-pay

## 2024-02-24 ENCOUNTER — Encounter: Payer: Self-pay | Admitting: Family Medicine

## 2024-02-24 ENCOUNTER — Ambulatory Visit (INDEPENDENT_AMBULATORY_CARE_PROVIDER_SITE_OTHER): Admitting: Family Medicine

## 2024-02-24 VITALS — BP 126/73 | HR 65 | Ht 64.0 in | Wt 166.4 lb

## 2024-02-24 DIAGNOSIS — D508 Other iron deficiency anemias: Secondary | ICD-10-CM

## 2024-02-24 DIAGNOSIS — J9611 Chronic respiratory failure with hypoxia: Secondary | ICD-10-CM | POA: Diagnosis not present

## 2024-02-24 DIAGNOSIS — R7989 Other specified abnormal findings of blood chemistry: Secondary | ICD-10-CM | POA: Insufficient documentation

## 2024-02-24 DIAGNOSIS — J449 Chronic obstructive pulmonary disease, unspecified: Secondary | ICD-10-CM | POA: Diagnosis not present

## 2024-02-24 DIAGNOSIS — G252 Other specified forms of tremor: Secondary | ICD-10-CM | POA: Diagnosis not present

## 2024-02-24 NOTE — Patient Instructions (Signed)
" ° °  YOUR PLAN: CHRONIC OBSTRUCTIVE PULMONARY DISEASE WITH OXYGEN  DEPENDENCE: You have COPD and need oxygen  therapy. Your use of the rescue inhaler has increased with activity. -Continue using Advair, Incruse, and your rescue inhaler as needed. -Keep using oxygen  therapy at night and start using portable oxygen  during the day. -Consider a referral to [facility] for a lung transplant evaluation. -If you have a COPD exacerbation,  let us  know and I can send in an antibiotic and prednisone  for 5 days.  LOWER EXTREMITY WEAKNESS AND TREMOR: You have persistent leg weakness and tremors, especially while driving. We need to evaluate your nerve function. -Proceed with the scheduled EMG study next month. -We will decide on an MRI based on the EMG results.  SUBCLINICAL HYPOTHYROIDISM: Your thyroid  function tests show mildly elevated TSH but normal T3 and T4 levels. You do not have significant symptoms. There is no indication to start a thyroid  medication.    -Continue taking your iron  supplements.    Contains text generated by [employer].   "

## 2024-02-24 NOTE — Assessment & Plan Note (Addendum)
 Chronic obstructive pulmonary disease with oxygen  dependence COPD with oxygen  dependence, increased rescue inhaler use due to activity. Discussed potential lung transplant referral to [facility], considering quality of life and benefits.  - Continue Advair, Incruse, and rescue inhaler as needed. - Continue oxygen  therapy at night and portable oxygen  during the day. - f/u after referral to [facility] for lung transplant evaluation. - continue pulm rehab - Prescribe azithromycin and prednisone  for 5 days if exacerbation occurs.

## 2024-02-24 NOTE — Assessment & Plan Note (Signed)
 2/2 severe emphysema.  Mostly needs it at night and with exertion.  Continue current tx.

## 2024-02-24 NOTE — Telephone Encounter (Signed)
 Called to confirm appt. Pt confirmed appt. Instructed pt on proper footwear. Gave directions along with department number.

## 2024-02-24 NOTE — Assessment & Plan Note (Addendum)
 Persistent weakness and tremor, especially when driving. EMG scheduled to evaluate nerve function. Awaiting results to decide on MRI necessity. I don't think this is related to her low vit E given the alpha value is normal.  She is also taking vit E supplementation.  - Proceed with scheduled EMG study next month. - Order MRI based on EMG results.

## 2024-02-24 NOTE — Assessment & Plan Note (Signed)
 Previously diagnosed iron  deficiency, currently on supplementation. Last iron  level normalized four months ago. - Continue iron  supplementation.

## 2024-02-24 NOTE — Assessment & Plan Note (Signed)
 Mildly elevated TSH at 6.3, normal T3 and T4, no significant symptoms or need for thyroid  replacement therapy. - Continue monitoring thyroid  function tests.

## 2024-02-24 NOTE — Progress Notes (Signed)
 "   Subjective   Patient ID: Jasmin Berger, female    DOB: Jun 27, 1970  Age: 54 y.o. MRN: [id number]  Chief Complaint  Patient presents with   Medical Management of Chronic Issues    History of Present Illness   Jasmin Berger is a 54 year old female with COPD who presents with breathing difficulties and leg tremors.  She describes persistent dyspnea with a sensation of not getting enough air. She is in pulmonary rehabilitation and uses oxygen  at night. She is awaiting portable oxygen  for daytime use. She uses Advair and Incruse inhalers plus a rescue inhaler, which she has needed more often in recent weeks, especially with activity. She denies infectious symptoms including rhinorrhea, eye redness, cough, or sore throat.  She is scheduled for an EMG next month to evaluate leg tremors and weakness.   Recent labs showed normal vit. E alpha tocopherol with low gamma tocopherol. She is taking vitamin E alrady Her TSH is mildly elevated at 6.3, improved from 7.5, with normal T3 and T4. Current medications are bupropion  twice daily, gabapentin  at bedtime, iron  supplements, and propranolol  once a day.  She has not needed corticosteroids for COPD exacerbations since 2024. She has been referred to [facility] to discuss possible lung transplant for advanced COPD.          The 10-year ASCVD risk score (Arnett DK, et al., 2019) is: 1.8%  Health Maintenance Due  Topic Date Due   Hepatitis C Screening  Never done   Hepatitis B Vaccines 19-59 Average Risk (1 of 3 - 19+ 3-dose series) Never done   COVID-19 Vaccine (1 - 2025-26 season) Never done      Objective:     BP 126/73   Pulse 65   Ht 5' 4 (1.626 m)   Wt 166 lb 6.4 oz (75.5 kg)   SpO2 94%   BMI 28.56 kg/m    Physical Exam   Gen: alert, oriented CHEST: LCTAB. No wheezes, rhonchi, or crackles.  Psych: pleasant affect        No results found for any visits on 02/24/24.      Assessment & Plan:   Chronic obstructive  pulmonary disease, unspecified COPD type (HCC) Assessment & Plan: Chronic obstructive pulmonary disease with oxygen  dependence COPD with oxygen  dependence, increased rescue inhaler use due to activity. Discussed potential lung transplant referral to [facility], considering quality of life and benefits.  - Continue Advair, Incruse, and rescue inhaler as needed. - Continue oxygen  therapy at night and portable oxygen  during the day. - f/u after referral to [facility] for lung transplant evaluation. - continue pulm rehab - Prescribe azithromycin and prednisone  for 5 days if exacerbation occurs.   Kinetic tremor Assessment & Plan: Persistent weakness and tremor, especially when driving. EMG scheduled to evaluate nerve function. Awaiting results to decide on MRI necessity. I don't think this is related to her low vit E given the alpha value is normal.  She is also taking vit E supplementation.  - Proceed with scheduled EMG study next month. - Order MRI based on EMG results.   Elevated TSH Assessment & Plan: Mildly elevated TSH at 6.3, normal T3 and T4, no significant symptoms or need for thyroid  replacement therapy. - Continue monitoring thyroid  function tests.   Other iron  deficiency anemia Assessment & Plan: Previously diagnosed iron  deficiency, currently on supplementation. Last iron  level normalized four months ago. - Continue iron  supplementation.    Chronic hypoxemic respiratory failure (HCC) Assessment & Plan: 2/2  severe emphysema.  Mostly needs it at night and with exertion.  Continue current tx.                 Return in about 4 months (around 06/23/2024) for pulm, tremor.    Toribio MARLA Slain, MD  "

## 2024-02-25 ENCOUNTER — Encounter (HOSPITAL_COMMUNITY)
Admission: RE | Admit: 2024-02-25 | Discharge: 2024-02-25 | Disposition: A | Discharge status: Home / Self Care | Source: Ambulatory Visit | Attending: Pulmonary Disease | Admitting: Pulmonary Disease

## 2024-02-25 ENCOUNTER — Encounter (HOSPITAL_COMMUNITY): Payer: Self-pay

## 2024-02-25 VITALS — BP 132/80 | HR 74 | Wt 167.3 lb

## 2024-02-25 DIAGNOSIS — J449 Chronic obstructive pulmonary disease, unspecified: Secondary | ICD-10-CM | POA: Insufficient documentation

## 2024-02-25 NOTE — Telephone Encounter (Signed)
 [facility] is calling concerning patient and a portable oxygen  concentrator she is currently working with [employer] and she is wanting poc [facility] is saying its a waitlist to get one and she wants  to switch to [facility] for the poc . [facility] is also requesting someone give patient a call with a update

## 2024-02-25 NOTE — Progress Notes (Signed)
 Pulmonary Individual Treatment Plan  Patient Details  Name: Jasmin Berger MRN: [id number] Date of Birth: Dec 19, 1970 Referring Provider:   Flowsheet Row PULMONARY REHAB COPD ORIENTATION from 02/25/2024 in [facility]  Referring Provider Ol[place]re    Initial Encounter Date:  Flowsheet Row PULMONARY REHAB COPD ORIENTATION from 02/25/2024 in [facility] for [facility]  Date 02/25/24    Visit Diagnosis: Stage 3 severe COPD by GOLD classification (HCC)  Patient's Home Medications on Admission:  Current Medications[1]  Past Medical History: Past Medical History:  Diagnosis Date   Arthritis    COPD (chronic obstructive pulmonary disease) (HCC)    TESTing in 8/24 to verify   Emphysema of lung (HCC)    Testing 8/24 to verify   Heart murmur    Mitral valve prolapse    Oxygen  deficiency    Uses at night  1 liter    Tobacco Use: Tobacco Use History[2]  Labs: Review Flowsheet       Latest Ref Rng & Units 05/21/2022 05/27/2022 07/15/2023  Labs for ITP Cardiac and Pulmonary Rehab  Cholestrol 100 - 199 mg/dL - 814  791   LDL (calc) 0 - 99 mg/dL - 87  867   HDL-C >60 mg/dL - 83  61   Trlycerides 0 - 149 mg/dL - 83  84   Hemoglobin J8r 4.8 - 5.6 % - 5.6  5.3   Bicarbonate 20.0 - 28.0 mmol/L 29.1  - -  O2 Saturation % 52.7  - -    Capillary Blood Glucose: No results found for: GLUCAP   Pulmonary Assessment Scores:  Pulmonary Assessment Scores     Row Name 02/25/24 1050         ADL UCSD   ADL Phase Entry     SOB Score total 87       CAT Score   CAT Score 17       mMRC Score   mMRC Score 4       UCSD: Self-administered rating of dyspnea associated with activities of daily living (ADLs) 6-point scale (0 = not at all to 5 = maximal or unable to do because of breathlessness)  Scoring Scores range from 0 to 120.  Minimally important difference is 5  units  CAT: CAT can identify the health impairment of COPD patients and is better correlated with disease progression.  CAT has a scoring range of zero to 40. The CAT score is classified into four groups of low (less than 10), medium (10 - 20), high (21-30) and very high (31-40) based on the impact level of disease on health status. A CAT score over 10 suggests significant symptoms.  A worsening CAT score could be explained by an exacerbation, poor medication adherence, poor inhaler technique, or progression of COPD or comorbid conditions.  CAT MCID is 2 points  mMRC: mMRC (Modified Medical Research Council) Dyspnea Scale is used to assess the degree of baseline functional disability in patients of respiratory disease due to dyspnea. No minimal important difference is established. A decrease in score of 1 point or greater is considered a positive change.   Pulmonary Function Assessment:  Pulmonary Function Assessment - 02/25/24 1049       Breath   Bilateral Breath Sounds Clear;Decreased    Shortness of Breath Yes;Fear of Shortness of Breath;Limiting activity          Exercise Target Goals: Exercise Program Goal: Individual  exercise prescription set using results from initial 6 min walk test and THRR while considering  patients activity barriers and safety.   Exercise Prescription Goal: Initial exercise prescription builds to 30-45 minutes a day of aerobic activity, 2-3 days per week.  Home exercise guidelines will be given to patient during program as part of exercise prescription that the participant will acknowledge.  Activity Barriers & Risk Stratification:  Activity Barriers & Cardiac Risk Stratification - 02/25/24 1057       Activity Barriers & Cardiac Risk Stratification   Activity Barriers Deconditioning;Muscular Weakness;Shortness of Breath;Arthritis;Back Problems;Balance Concerns    Cardiac Risk Stratification Moderate          6 Minute Walk:  6 Minute Walk      Row Name 02/25/24 1139         6 Minute Walk   Phase Initial     Distance 1021 feet     Walk Time 6 minutes     # of Rest Breaks 0     MPH 1.93     METS 3.3     RPE 13     Perceived Dyspnea  1     VO2 Peak 11.56     Symptoms No     Resting HR 74 bpm     Resting BP 132/80     Resting Oxygen  Saturation  96 %     Exercise Oxygen  Saturation  during 6 min walk 90 %     Max Ex. HR 103 bpm     Max Ex. BP 152/84     2 Minute Post BP 142/80       Interval HR   1 Minute HR 90     2 Minute HR 94     3 Minute HR 100     4 Minute HR 98     5 Minute HR 98     6 Minute HR 103     2 Minute Post HR 78     Interval Heart Rate? Yes       Interval Oxygen    Interval Oxygen ? Yes     Baseline Oxygen  Saturation % 96 %     1 Minute Oxygen  Saturation % 95 %     1 Minute Liters of Oxygen  1 L     2 Minute Oxygen  Saturation % 93 %     2 Minute Liters of Oxygen  1 L     3 Minute Oxygen  Saturation % 91 %     3 Minute Liters of Oxygen  1 L     4 Minute Oxygen  Saturation % 90 %     4 Minute Liters of Oxygen  1 L     5 Minute Oxygen  Saturation % 90 %     5 Minute Liters of Oxygen  1 L     6 Minute Oxygen  Saturation % 90 %     6 Minute Liters of Oxygen  1 L     2 Minute Post Oxygen  Saturation % 94 %     2 Minute Post Liters of Oxygen  1 L        Oxygen  Initial Assessment:  Oxygen  Initial Assessment - 02/25/24 1059       Home Oxygen    Home Oxygen  Device Home Concentrator;E-Tanks    Sleep Oxygen  Prescription Continuous    Liters per minute 1    Home Exercise Oxygen  Prescription Continuous    Liters per minute 1    Home Resting Oxygen  Prescription Continuous    Liters per  minute 1      Initial 6 min Walk   Oxygen  Used Continuous    Liters per minute 1      Program Oxygen  Prescription   Program Oxygen  Prescription Continuous    Liters per minute 1      Intervention   Short Term Goals To learn and exhibit compliance with exercise, home and travel O2 prescription;To learn and understand  importance of monitoring SPO2 with pulse oximeter and demonstrate accurate use of the pulse oximeter.;To learn and understand importance of maintaining oxygen  saturations>88%;To learn and demonstrate proper pursed lip breathing techniques or other breathing techniques. ;To learn and demonstrate proper use of respiratory medications    Long  Term Goals Exhibits compliance with exercise, home  and travel O2 prescription;Maintenance of O2 saturations>88%;Compliance with respiratory medication;Verbalizes importance of monitoring SPO2 with pulse oximeter and return demonstration;Exhibits proper breathing techniques, such as pursed lip breathing or other method taught during program session;Demonstrates proper use of MDIs          Oxygen  Re-Evaluation:   Oxygen  Discharge (Final Oxygen  Re-Evaluation):   Initial Exercise Prescription:  Initial Exercise Prescription - 02/25/24 1100       Date of Initial Exercise RX and Referring Provider   Date 02/25/24    Referring Provider Olalere    Expected Discharge Date 05/18/24      Oxygen    Oxygen  Continuous    Liters 1    Maintain Oxygen  Saturation 88% or higher      NuStep   Level 1    SPM 60    Minutes 15    METs 1.5      Track   Minutes 15    METs 2      Prescription Details   Frequency (times per week) 2    Duration Progress to 30 minutes of continuous aerobic without signs/symptoms of physical distress      Intensity   THRR 40-80% of Max Heartrate 66-132    Ratings of Perceived Exertion 11-13    Perceived Dyspnea 0-4      Progression   Progression Continue to progress workloads to maintain intensity without signs/symptoms of physical distress.      Resistance Training   Training Prescription Yes    Weight red bands    Reps 10-15          Perform Capillary Blood Glucose checks as needed.  Exercise Prescription Changes:   Exercise Comments:   Exercise Goals and Review:   Exercise Goals     Row Name 02/25/24  1044             Exercise Goals   Increase Physical Activity Yes       Intervention Provide advice, education, support and counseling about physical activity/exercise needs.;Develop an individualized exercise prescription for aerobic and resistive training based on initial evaluation findings, risk stratification, comorbidities and participant's personal goals.       Expected Outcomes Short Term: Attend rehab on a regular basis to increase amount of physical activity.;Long Term: Add in home exercise to make exercise part of routine and to increase amount of physical activity.;Long Term: Exercising regularly at least 3-5 days a week.       Increase Strength and Stamina Yes       Intervention Provide advice, education, support and counseling about physical activity/exercise needs.;Develop an individualized exercise prescription for aerobic and resistive training based on initial evaluation findings, risk stratification, comorbidities and participant's personal goals.       Expected  Outcomes Short Term: Increase workloads from initial exercise prescription for resistance, speed, and METs.;Short Term: Perform resistance training exercises routinely during rehab and add in resistance training at home;Long Term: Improve cardiorespiratory fitness, muscular endurance and strength as measured by increased METs and functional capacity ( )       Able to understand and use rate of perceived exertion (RPE) scale Yes       Intervention Provide education and explanation on how to use RPE scale       Expected Outcomes Short Term: Able to use RPE daily in rehab to express subjective intensity level;Long Term:  Able to use RPE to guide intensity level when exercising independently       Able to understand and use Dyspnea scale Yes       Intervention Provide education and explanation on how to use Dyspnea scale       Expected Outcomes Short Term: Able to use Dyspnea scale daily in rehab to express subjective sense  of shortness of breath during exertion;Long Term: Able to use Dyspnea scale to guide intensity level when exercising independently       Knowledge and understanding of Target Heart Rate Range (THRR) Yes       Intervention Provide education and explanation of THRR including how the numbers were predicted and where they are located for reference       Expected Outcomes Short Term: Able to state/look up THRR;Long Term: Able to use THRR to govern intensity when exercising independently;Short Term: Able to use daily as guideline for intensity in rehab       Understanding of Exercise Prescription Yes       Intervention Provide education, explanation, and written materials on patient's individual exercise prescription       Expected Outcomes Short Term: Able to explain program exercise prescription;Long Term: Able to explain home exercise prescription to exercise independently          Exercise Goals Re-Evaluation :   Discharge Exercise Prescription (Final Exercise Prescription Changes):   Nutrition:  Target Goals: Understanding of nutrition guidelines, daily intake of sodium 1500mg , cholesterol 200mg , calories 30% from fat and 7% or less from saturated fats, daily to have 5 or more servings of fruits and vegetables.  Biometrics:    Nutrition Therapy Plan and Nutrition Goals:   Nutrition Assessments:  MEDIFICTS Score Key: >=70 Need to make dietary changes  40-70 Heart Healthy Diet <= 40 Therapeutic Level Cholesterol Diet   Picture Your Plate Scores: <59 Unhealthy dietary pattern with much room for improvement. 41-50 Dietary pattern unlikely to meet recommendations for good health and room for improvement. 51-60 More healthful dietary pattern, with some room for improvement.  >60 Healthy dietary pattern, although there may be some specific behaviors that could be improved.    Nutrition Goals Re-Evaluation:   Nutrition Goals Discharge (Final Nutrition Goals  Re-Evaluation):   Psychosocial: Target Goals: Acknowledge presence or absence of significant depression and/or stress, maximize coping skills, provide positive support system. Participant is able to verbalize types and ability to use techniques and skills needed for reducing stress and depression.  Initial Review & Psychosocial Screening:  Initial Psych Review & Screening - 02/25/24 1055       Initial Review   Current issues with Current Psychotropic Meds;None Identified      Family Dynamics   Good Support System? Yes      Barriers   Psychosocial barriers to participate in program There are no identifiable barriers or psychosocial needs.  Screening Interventions   Interventions Encouraged to exercise    Expected Outcomes Short Term goal: Identification and review with participant of any Quality of Life or Depression concerns found by scoring the questionnaire.;Long Term goal: The participant improves quality of Life and PHQ9 Scores as seen by post scores and/or verbalization of changes          Quality of Life Scores:  Scores of 19 and below usually indicate a poorer quality of life in these areas.  A difference of  2-3 points is a clinically meaningful difference.  A difference of 2-3 points in the total score of the Quality of Life Index has been associated with significant improvement in overall quality of life, self-image, physical symptoms, and general health in studies assessing change in quality of life.  PHQ-9: Review Flowsheet  More data exists      02/25/2024 02/24/2024 12/23/2023 07/22/2023 03/24/2023  Depression screen PHQ 2/9  Decreased Interest 0 0 0 0 0  Down, Depressed, Hopeless 0 0 0 0 0  PHQ - 2 Score 0 0 0 0 0  Altered sleeping 0 0 2 1 0  Tired, decreased energy 0 0 0 3 0  Change in appetite 0 0 0 0 0  Feeling bad or failure about yourself  0 0 0 0 0  Trouble concentrating 0 0 0 0 0  Moving slowly or fidgety/restless 0 0 0 0 0  Suicidal thoughts 0 0 0  0 0  PHQ-9 Score 0 0 2 4  0   Difficult doing work/chores Not difficult at all - Not difficult at all Somewhat difficult Not difficult at all    Details       Data saved with a previous flowsheet row definition        Interpretation of Total Score  Total Score Depression Severity:  1-4 = Minimal depression, 5-9 = Mild depression, 10-14 = Moderate depression, 15-19 = Moderately severe depression, 20-27 = Severe depression   Psychosocial Evaluation and Intervention:  Psychosocial Evaluation - 02/25/24 1127       Psychosocial Evaluation & Interventions   Interventions Encouraged to exercise with the program and follow exercise prescription    Comments Luke denies any psy/soc barriers or concerns at this time. She is currently taking psychotropic meds and is compliant with taking these. She denies any needs at this time.    Expected Outcomes For Kim to participate in PR free of any psy/soc barriers or concerns    Continue Psychosocial Services  No Follow up required          Psychosocial Re-Evaluation:   Psychosocial Discharge (Final Psychosocial Re-Evaluation):   Education: Education Goals: Education classes will be provided on a weekly basis, covering required topics. Participant will state understanding/return demonstration of topics presented.  Learning Barriers/Preferences:  Learning Barriers/Preferences - 02/25/24 1056       Learning Barriers/Preferences   Learning Barriers None    Learning Preferences None          Education Topics: Know Your Numbers Group instruction that is supported by a PowerPoint presentation. Instructor discusses importance of knowing and understanding resting, exercise, and post-exercise oxygen  saturation, heart rate, and blood pressure. Oxygen  saturation, heart rate, blood pressure, rating of perceived exertion, and dyspnea are reviewed along with a normal range for these values.    Exercise for the Pulmonary Patient Group  instruction that is supported by a PowerPoint presentation. Instructor discusses benefits of exercise, core components of exercise, frequency, duration, and intensity of  an exercise routine, importance of utilizing pulse oximetry during exercise, safety while exercising, and options of places to exercise outside of rehab.    MET Level  Group instruction provided by PowerPoint, verbal discussion, and written material to support subject matter. Instructor reviews what METs are and how to increase METs.    Pulmonary Medications Verbally interactive group education provided by instructor with focus on inhaled medications and proper administration.   Anatomy and Physiology of the Respiratory System Group instruction provided by PowerPoint, verbal discussion, and written material to support subject matter. Instructor reviews respiratory cycle and anatomical components of the respiratory system and their functions. Instructor also reviews differences in obstructive and restrictive respiratory diseases with examples of each.    Oxygen  Safety Group instruction provided by PowerPoint, verbal discussion, and written material to support subject matter. There is an overview of What is Oxygen  and Why do we need it.  Instructor also reviews how to create a safe environment for oxygen  use, the importance of using oxygen  as prescribed, and the risks of noncompliance. There is a brief discussion on traveling with oxygen  and resources the patient may utilize.   Oxygen  Use Group instruction provided by PowerPoint, verbal discussion, and written material to discuss how supplemental oxygen  is prescribed and different types of oxygen  supply systems. Resources for more information are provided.    Breathing Techniques Group instruction that is supported by demonstration and informational handouts. Instructor discusses the benefits of pursed lip and diaphragmatic breathing and detailed demonstration on how to  perform both.     Risk Factor Reduction Group instruction that is supported by a PowerPoint presentation. Instructor discusses the definition of a risk factor, different risk factors for pulmonary disease, and how the heart and lungs work together.   Pulmonary Diseases Group instruction provided by PowerPoint, verbal discussion, and written material to support subject matter. Instructor gives an overview of the different type of pulmonary diseases. There is also a discussion on risk factors and symptoms as well as ways to manage the diseases.   Stress and Energy Conservation Group instruction provided by PowerPoint, verbal discussion, and written material to support subject matter. Instructor gives an overview of stress and the impact it can have on the body. Instructor also reviews ways to reduce stress. There is also a discussion on energy conservation and ways to conserve energy throughout the day.   Warning Signs and Symptoms Group instruction provided by PowerPoint, verbal discussion, and written material to support subject matter. Instructor reviews warning signs and symptoms of stroke, heart attack, cold and flu. Instructor also reviews ways to prevent the spread of infection.   Other Education Group or individual verbal, written, or video instructions that support the educational goals of the pulmonary rehab program.    Knowledge Questionnaire Score:  Knowledge Questionnaire Score - 02/25/24 1050       Knowledge Questionnaire Score   Pre Score 17/18          Core Components/Risk Factors/Patient Goals at Admission:  Personal Goals and Risk Factors at Admission - 02/25/24 1056       Core Components/Risk Factors/Patient Goals on Admission    Weight Management Weight Loss;Yes    Intervention Weight Management: Develop a combined nutrition and exercise program designed to reach desired caloric intake, while maintaining appropriate intake of nutrient and fiber, sodium and  fats, and appropriate energy expenditure required for the weight goal.;Weight Management: Provide education and appropriate resources to help participant work on and attain dietary goals.;Weight Management/Obesity:  Establish reasonable short term and long term weight goals.;Obesity: Provide education and appropriate resources to help participant work on and attain dietary goals.    Admit Weight 167 lb 5.3 oz (75.9 kg)    Expected Outcomes Short Term: Continue to assess and modify interventions until short term weight is achieved;Long Term: Adherence to nutrition and physical activity/exercise program aimed toward attainment of established weight goal;Weight Loss: Understanding of general recommendations for a balanced deficit meal plan, which promotes 1-2 lb weight loss per week and includes a negative energy balance of [phone] kcal/d;Understanding recommendations for meals to include 15-35% energy as protein, 25-35% energy from fat, 35-60% energy from carbohydrates, less than 200mg  of dietary cholesterol, 20-35 gm of total fiber daily;Understanding of distribution of calorie intake throughout the day with the consumption of 4-5 meals/snacks    Improve shortness of breath with ADL's Yes    Intervention Provide education, individualized exercise plan and daily activity instruction to help decrease symptoms of SOB with activities of daily living.    Expected Outcomes Short Term: Improve cardiorespiratory fitness to achieve a reduction of symptoms when performing ADLs;Long Term: Be able to perform more ADLs without symptoms or delay the onset of symptoms          Core Components/Risk Factors/Patient Goals Review:    Core Components/Risk Factors/Patient Goals at Discharge (Final Review):    ITP Comments:   Comments: Dr. Slater Staff is Medical Director for Pulmonary Rehab at [facility].       [1]  Current Outpatient Medications:    albuterol  (VENTOLIN  HFA) 108 (90 Base) MCG/ACT  inhaler, Inhale 2 puffs into the lungs every 4 (four) hours as needed for wheezing or shortness of breath., Disp: 1 each, Rfl: 11   buPROPion  (WELLBUTRIN  SR) 150 MG 12 hr tablet, TAKE 1 TABLET BY MOUTH TWICE DAILY, Disp: 60 tablet, Rfl: 3   Difluprednate 0.05 % EMUL, , Disp: , Rfl:    fluticasone -salmeterol (ADVAIR HFA) 230-21 MCG/ACT inhaler, Inhale 2 puffs into the lungs 2 (two) times daily., Disp: 1 each, Rfl: 5   gabapentin  (NEURONTIN ) 400 MG tablet, Take 1 tablet (400 mg total) by mouth at bedtime., Disp: 30 tablet, Rfl: 2   Iron , Ferrous Sulfate , 325 (65 Fe) MG TABS, Take 325 mg by mouth every other day., Disp: 30 tablet, Rfl: 5   Loratadine (CLARITIN) 10 MG CAPS, Take by mouth., Disp: , Rfl:    LORazepam  (ATIVAN ) 1 MG tablet, Take 1 tablet orally 2 hours before MRI, Disp: 1 tablet, Rfl: 0   Multiple Vitamin (MULTIVITAMIN) tablet, Take 1 tablet by mouth daily., Disp: , Rfl:    polyethylene glycol powder (GLYCOLAX/MIRALAX) 17 GM/SCOOP powder, Take 1 Container by mouth once., Disp: , Rfl:    propranolol  ER (INDERAL  LA) 80 MG 24 hr capsule, Take 1 capsule (80 mg total) by mouth daily., Disp: 30 capsule, Rfl: 2   umeclidinium bromide  (INCRUSE ELLIPTA ) 62.5 MCG/ACT AEPB, Inhale 1 puff into the lungs daily., Disp: 30 each, Rfl: 3   vitamin B-12 (CYANOCOBALAMIN ) 100 MCG tablet, Take 100 mcg by mouth daily., Disp: , Rfl:    vitamin E 180 MG (400 UNITS) capsule, Take 400 Units by mouth daily., Disp: , Rfl:  [2]  Social History Tobacco Use  Smoking Status Former   Current packs/day: 1.00   Types: Cigarettes  Smokeless Tobacco Never  Tobacco Comments   Pt states she quit 05/21/22

## 2024-02-25 NOTE — Progress Notes (Signed)
 Jasmin Berger 54 y.o. female Pulmonary Rehab Orientation Note This patient who was referred to Pulmonary Rehab by Dr. Neda with the diagnosis of COPD 3 arrived today in Cardiac and Pulmonary Rehab. She  arrived ambulatory with normal gait. She  does carry portable oxygen . [employer] is the provider for their DME. Per patient, Jasmin Berger uses oxygen  at night after sleep, at night when going to sleep, with exercise. Color good, skin warm and dry. Patient is oriented to time and place. Patient's medical history, psychosocial health, and medications reviewed. Psychosocial assessment reveals patient lives with roommate. Jasmin Berger is currently unemployed, disabled. Patient hobbies include watching tv and spending time with others. Patient reports her stress level is low. Areas of stress/anxiety include health. Patient does not exhibit signs of depression. PHQ2/9 score 0/0. Jasmin Berger shows good  coping skills with positive outlook on life. Offered emotional support and reassurance. Will continue to monitor. Physical assessment performed by Nurse pick: Jasmin Levin RN. Please see their orientation physical assessment note. Jasmin Berger reports she does take medications as prescribed. Patient states she follows a regular  diet. The patient is wanting to lose weight. Patient's weight will be monitored closely. Demonstration and practice of PLB using pulse oximeter. Jasmin Berger able to return demonstration satisfactorily. Safety and hand hygiene in the exercise area reviewed with patient. Jasmin Berger understanding of the information reviewed. Department expectations discussed with patient and achievable goals were set. The patient shows enthusiasm about attending the program and we look forward to working with Jasmin. Jasmin Berger completed a 6 min walk test today and is scheduled to begin exercise on 03/02/24.   1025-1140 Jasmin Berger, BSRT

## 2024-02-29 ENCOUNTER — Encounter: Payer: Self-pay | Admitting: Emergency Medicine

## 2024-02-29 ENCOUNTER — Ambulatory Visit

## 2024-02-29 ENCOUNTER — Ambulatory Visit
Admission: EM | Admit: 2024-02-29 | Discharge: 2024-02-29 | Disposition: A | Discharge status: Home / Self Care | Attending: Physician Assistant | Admitting: Physician Assistant

## 2024-02-29 DIAGNOSIS — W19XXXA Unspecified fall, initial encounter: Secondary | ICD-10-CM

## 2024-02-29 DIAGNOSIS — S42202A Unspecified fracture of upper end of left humerus, initial encounter for closed fracture: Secondary | ICD-10-CM

## 2024-02-29 MED ORDER — OXYCODONE-ACETAMINOPHEN 5-325 MG PO TABS: 1.0000 | ORAL_TABLET | ORAL | 0 refills | Status: AC | PRN

## 2024-02-29 NOTE — Discharge Instructions (Addendum)
Schedule to see the Orthopaedist for evaluation  

## 2024-02-29 NOTE — ED Provider Notes (Signed)
 " [facility]    CSN: [id number] Arrival date & time: 02/29/24  1036      History   Chief Complaint Chief Complaint  Patient presents with   Arm Injury    HPI Jasmin Berger is a 54 y.o. female.   Pt complains she fell 4 days ago.  Pt reports she tripped and fell.  Pt complains of pain in her shoulder.  Pt reports she has had area wrapped in a cloth to keep it from moving.  Pt denies any other area of injury.  Pt denies any impact of her head   The history is provided by the patient. No language interpreter was used.  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   Situational anxiety 12/23/2023   Right leg weakness 12/23/2023   Elevated BP without diagnosis of hypertension 08/24/2023   Iron  deficiency anemia 08/24/2023   RLS (restless legs syndrome) 07/28/2023   HLD (hyperlipidemia) 07/28/2023   Colitis 09/21/2022   Arthritis 07/21/2022   Pap smear for cervical cancer screening 07/21/2022   Bilateral hand pain 06/23/2022   Alcohol abuse 05/27/2022   Chronic obstructive pulmonary disease (HCC) 05/27/2022   Hyponatremia 05/27/2022   Encounter to establish care 05/27/2022   Electrolyte disturbance 05/27/2022    Past Surgical History:  Procedure Laterality Date   ABDOMINAL HYSTERECTOMY     ADENOIDECTOMY     BREAST BIOPSY Right 08/03/2022   US  RT BREAST BX W LOC DEV 1ST LESION IMG BX SPEC US  GUIDE 08/03/2022 [facility]  MAMMOGRAPHY   CESAREAN SECTION     TYMPANOSTOMY TUBE PLACEMENT      OB History   No obstetric history on file.      Home Medications    Prior to Admission medications  Medication Sig Start Date End Date Taking? Authorizing Provider  oxyCODONE -acetaminophen  (PERCOCET) 5-325 MG tablet Take 1 tablet by mouth every 4 (four) hours as needed for severe pain (pain score 7-10). 02/29/24 02/28/25 Yes Flint Sonny POUR, PA-C  albuterol  (VENTOLIN  HFA) 108 (90 Base) MCG/ACT inhaler Inhale 2 puffs into the lungs every 4 (four) hours as needed for wheezing or shortness of breath. 06/23/22   Chandra Toribio POUR, MD  buPROPion  (WELLBUTRIN  SR) 150 MG 12 hr tablet TAKE 1 TABLET BY MOUTH TWICE DAILY 07/13/23   Neda Hammond A, MD  Difluprednate 0.05 % EMUL  08/17/23   [provider]  fluticasone -salmeterol (ADVAIR HFA) 230-21 MCG/ACT inhaler Inhale 2 puffs into the lungs 2 (two) times daily. 02/11/24   Neda Hammond LABOR, MD  gabapentin  (NEURONTIN ) 400 MG tablet Take 1 tablet (400 mg total) by mouth at bedtime. 12/23/23 12/22/24  Chandra Toribio POUR, MD  Iron , Ferrous Sulfate , 325 (65 Fe) MG TABS Take 325 mg by mouth every other day. 08/24/23  Chandra Toribio POUR, MD  Loratadine (CLARITIN) 10 MG CAPS Take by mouth.    [provider]  LORazepam  (ATIVAN ) 1 MG tablet Take 1 tablet orally 2 hours before MRI 01/18/24   Olson, Daniel K, MD  Multiple Vitamin (MULTIVITAMIN) tablet Take 1 tablet by mouth daily.    [provider]  polyethylene glycol powder (GLYCOLAX/MIRALAX) 17 GM/SCOOP powder Take 1 Container by mouth once.    [provider]  propranolol  ER (INDERAL  LA) 80 MG 24 hr capsule Take 1 capsule (80 mg total) by mouth daily. 01/18/24   Chandra Toribio POUR, MD  umeclidinium bromide  (INCRUSE ELLIPTA ) 62.5 MCG/ACT AEPB Inhale 1 puff into the lungs daily. 02/11/24   Neda Jennet LABOR, MD  vitamin B-12 (CYANOCOBALAMIN ) 100 MCG tablet Take 100 mcg by mouth daily.    [provider]   vitamin E 180 MG (400 UNITS) capsule Take 400 Units by mouth daily.    [provider]    Family History Family History  Problem Relation Age of Onset   Colon polyps Mother    Crohn's disease Mother    Breast cancer Sister    Colon polyps Maternal Aunt    Crohn's disease Daughter    Colon cancer Neg Hx    Esophageal cancer Neg Hx    Rectal cancer Neg Hx    Stomach cancer Neg Hx     Social History Social History[1]   Allergies   Latex   Review of Systems Review of Systems  All other systems reviewed and are negative.    Physical Exam Triage Vital Signs ED Triage Vitals  Encounter Vitals Group     BP 02/29/24 1123 (!) 144/92     Girls Systolic BP Percentile --      Girls Diastolic BP Percentile --      Boys Systolic BP Percentile --      Boys Diastolic BP Percentile --      Pulse Rate 02/29/24 1123 81     Resp 02/29/24 1123 18     Temp 02/29/24 1123 98.4 F (36.9 C)     Temp Source 02/29/24 1123 Oral     SpO2 02/29/24 1123 94 %     Weight --      Height --      Head Circumference --      Peak Flow --      Pain Score 02/29/24 1121 9     Pain Loc --      Pain Education --      Exclude from Growth Chart --    No data found.  Updated Vital Signs BP (!) 144/92 (BP Location: Right Arm)   Pulse 81   Temp 98.4 F (36.9 C) (Oral)   Resp 18   SpO2 94%   Visual Acuity Right Eye Distance:   Left Eye Distance:   Bilateral Distance:    Right Eye Near:   Left Eye Near:    Bilateral Near:     Physical Exam Vitals and nursing note reviewed.  Constitutional:      Appearance: She is well-developed.  HENT:     Head: Normocephalic.  Cardiovascular:     Rate and Rhythm: Normal rate.  Pulmonary:     Effort: Pulmonary effort is normal.  Abdominal:     General: There is no distension.  Musculoskeletal:        General: Swelling and tenderness present.     Comments: Swollen tender left shoulder,  pain with movement.  Nv and ns intact   Skin:     General: Skin is warm.  Neurological:     General: No focal deficit present.     Mental Status: She is alert and oriented to person, place, and time.      UC Treatments / Results  Labs (all labs ordered are listed, but only abnormal results are displayed) Labs Reviewed - No data to display  EKG   Radiology DG Humerus Left Result Date: 02/29/2024 EXAM: 2 VIEW(S) XRAY OF THE LEFT HUMERUS 02/29/2024 12:09:29 PM COMPARISON: Chest radiograph 05/21/2022. CLINICAL HISTORY: 55 year old female. Tripped and fell 4 days ago, ongoing pain. FINDINGS: BONES AND JOINTS: Mildly comminuted fracture of the surgical neck of the humerus with mild impaction. SOFT TISSUES: Unremarkable. Negative visible left chest. IMPRESSION: 1. Comminuted and mildly impacted surgical neck fracture of the left humerus. Electronically signed by: Helayne Hurst MD 02/29/2024 12:38 PM EST RP Workstation: [id number]    Procedures Procedures (including critical care time)  Medications Ordered in UC Medications - No data to display  Initial Impression / Assessment and Plan / UC Course  I have reviewed the triage vital signs and the nursing notes.  Pertinent labs & imaging results that were available during my care of the patient were reviewed by me and considered in my medical decision making (see chart for details).    Pt placed in a shoulder immobilizer.  Pt advised to follow up with Dr. Celena Orthopaedist for evaluation.  Pt given an Rx for percocet.    Final Clinical Impressions(s) / UC Diagnoses   Final diagnoses:  Fall, initial encounter  Closed fracture of proximal end of left humerus, unspecified fracture morphology, initial encounter     Discharge Instructions      Schedule to see the Orthopaedist for evaluation.    ED Prescriptions     Medication Sig Dispense Auth. Provider   oxyCODONE -acetaminophen  (PERCOCET) 5-325 MG tablet Take 1 tablet by mouth every 4 (four) hours as needed for severe pain (pain  score 7-10). 20 tablet Chadwin Fury K, PA-C      I have reviewed the PDMP during this encounter. An After Visit Summary was printed and given to the patient.        [1]  Social History Tobacco Use   Smoking status: Former    Current packs/day: 1.00    Types: Cigarettes   Smokeless tobacco: Never   Tobacco comments:    Pt states she quit 05/21/22  Vaping Use   Vaping status: Never Used  Substance Use Topics   Alcohol use: Yes   Drug use: No     Flint Sonny POUR, PA-C 02/29/24 1356  "

## 2024-02-29 NOTE — ED Triage Notes (Signed)
 Patient presents for left arm injury x 4 days ago.  Patient tripped over her hose and hit the coffee table.  Patient is having a lot of pain and now its hard to moved the left arm.  Patient has been taking arthritis tylenol  and gabapentin  however it is not working.

## 2024-03-01 ENCOUNTER — Telehealth (HOSPITAL_COMMUNITY): Payer: Self-pay | Admitting: *Deleted

## 2024-03-01 ENCOUNTER — Telehealth (HOSPITAL_COMMUNITY): Payer: Self-pay

## 2024-03-01 NOTE — Telephone Encounter (Signed)
 Jasmin Berger left a message on the department voicemail yesterday. She is scheduled to start pulmonary rehab on Thursday, but she's broken her left shoulder ball joint. She requested a call back at [phone] to determine her next steps. Will forward message to her PR team for follow-up.

## 2024-03-01 NOTE — Telephone Encounter (Addendum)
 Returned pts call and spoke with patient about her broken shoulder. Suggested to patient that we could discharge her from the Pulmonary rehab program and once her shoulder is healed she can get another referral for the program. She agreed with this suggestion and understands that she will need another referral once her shoulder is healed.

## 2024-03-02 ENCOUNTER — Encounter (HOSPITAL_COMMUNITY)

## 2024-03-02 NOTE — Progress Notes (Signed)
 Discharge Progress Report  Patient Details  Name: Jasmin Berger MRN: [id number] Date of Birth: 26-Jul-1970 Referring Provider:   Flowsheet Row PULMONARY REHAB COPD ORIENTATION from 02/25/2024 in [facility]  Referring Provider [place]re     Number of Visits: 0  Reason for Discharge:  Early Exit:  Broke arm  Smoking History:  Tobacco Use History[1]  Diagnosis:  Stage 3 severe COPD by GOLD classification (HCC)  ADL UCSD:  Pulmonary Assessment Scores     Row Name 02/25/24 1050         ADL UCSD   ADL Phase Entry     SOB Score total 87       CAT Score   CAT Score 17       mMRC Score   mMRC Score 4        Initial Exercise Prescription:  Initial Exercise Prescription - 02/25/24 1100       Date of Initial Exercise RX and Referring Provider   Date 02/25/24    Referring Provider Ol[place]re    Expected Discharge Date 05/18/24      Oxygen    Oxygen  Continuous    Liters 1    Maintain Oxygen  Saturation 88% or higher      NuStep   Level 1    SPM 60    Minutes 15    METs 1.5      Track   Minutes 15    METs 2      Prescription Details   Frequency (times per week) 2    Duration Progress to 30 minutes of continuous aerobic without signs/symptoms of physical distress      Intensity   THRR 40-80% of Max Heartrate 66-132    Ratings of Perceived Exertion 11-13    Perceived Dyspnea 0-4      Progression   Progression Continue to progress workloads to maintain intensity without signs/symptoms of physical distress.      Resistance Training   Training Prescription Yes    Weight red bands    Reps 10-15          Discharge Exercise Prescription (Final Exercise Prescription Changes):   Functional Capacity:  6 Minute Walk     Row Name 02/25/24 1139         6 Minute Walk   Phase Initial     Distance 1021 feet     Walk Time 6 minutes     # of Rest Breaks 0     MPH 1.93     METS 3.3     RPE 13      Perceived Dyspnea  1     VO2 Peak 11.56     Symptoms No     Resting HR 74 bpm     Resting BP 132/80     Resting Oxygen  Saturation  96 %     Exercise Oxygen  Saturation  during 6 min walk 90 %     Max Ex. HR 103 bpm     Max Ex. BP 152/84     2 Minute Post BP 142/80       Interval HR   1 Minute HR 90     2 Minute HR 94     3 Minute HR 100     4 Minute HR 98     5 Minute HR 98     6 Minute HR 103     2 Minute Post HR 78  Interval Heart Rate? Yes       Interval Oxygen    Interval Oxygen ? Yes     Baseline Oxygen  Saturation % 96 %     1 Minute Oxygen  Saturation % 95 %     1 Minute Liters of Oxygen  1 L     2 Minute Oxygen  Saturation % 93 %     2 Minute Liters of Oxygen  1 L     3 Minute Oxygen  Saturation % 91 %     3 Minute Liters of Oxygen  1 L     4 Minute Oxygen  Saturation % 90 %     4 Minute Liters of Oxygen  1 L     5 Minute Oxygen  Saturation % 90 %     5 Minute Liters of Oxygen  1 L     6 Minute Oxygen  Saturation % 90 %     6 Minute Liters of Oxygen  1 L     2 Minute Post Oxygen  Saturation % 94 %     2 Minute Post Liters of Oxygen  1 L        Psychological, QOL, Others - Outcomes: PHQ 2/9:    02/25/2024   10:43 AM 02/24/2024    9:16 AM 12/23/2023    1:44 PM 07/22/2023    9:16 AM 03/24/2023    9:47 AM  Depression screen PHQ 2/9  Decreased Interest 0 0 0 0 0  Down, Depressed, Hopeless 0 0 0 0 0  PHQ - 2 Score 0 0 0 0 0  Altered sleeping 0 0 2 1 0  Tired, decreased energy 0 0 0 3 0  Change in appetite 0 0 0 0 0  Feeling bad or failure about yourself  0 0 0 0 0  Trouble concentrating 0 0 0 0 0  Moving slowly or fidgety/restless 0 0 0 0 0  Suicidal thoughts 0 0 0 0 0  PHQ-9 Score 0 0 2 4  0   Difficult doing work/chores Not difficult at all  Not difficult at all Somewhat difficult Not difficult at all     Data saved with a previous flowsheet row definition    Quality of Life:   Personal Goals: Goals established at orientation with interventions provided to work  toward goal.  Personal Goals and Risk Factors at Admission - 02/25/24 1056       Core Components/Risk Factors/Patient Goals on Admission    Weight Management Weight Loss;Yes    Intervention Weight Management: Develop a combined nutrition and exercise program designed to reach desired caloric intake, while maintaining appropriate intake of nutrient and fiber, sodium and fats, and appropriate energy expenditure required for the weight goal.;Weight Management: Provide education and appropriate resources to help participant work on and attain dietary goals.;Weight Management/Obesity: Establish reasonable short term and long term weight goals.;Obesity: Provide education and appropriate resources to help participant work on and attain dietary goals.    Admit Weight 167 lb 5.3 oz (75.9 kg)    Expected Outcomes Short Term: Continue to assess and modify interventions until short term weight is achieved;Long Term: Adherence to nutrition and physical activity/exercise program aimed toward attainment of established weight goal;Weight Loss: Understanding of general recommendations for a balanced deficit meal plan, which promotes 1-2 lb weight loss per week and includes a negative energy balance of [phone] kcal/d;Understanding recommendations for meals to include 15-35% energy as protein, 25-35% energy from fat, 35-60% energy from carbohydrates, less than 200mg  of dietary cholesterol, 20-35 gm of total fiber daily;Understanding of  distribution of calorie intake throughout the day with the consumption of 4-5 meals/snacks    Improve shortness of breath with ADL's Yes    Intervention Provide education, individualized exercise plan and daily activity instruction to help decrease symptoms of SOB with activities of daily living.    Expected Outcomes Short Term: Improve cardiorespiratory fitness to achieve a reduction of symptoms when performing ADLs;Long Term: Be able to perform more ADLs without symptoms or delay the onset  of symptoms           Personal Goals Discharge:  Goals and Risk Factor Review     Row Name 03/01/24 0931             Core Components/Risk Factors/Patient Goals Review   Personal Goals Review Weight Management/Obesity;Improve shortness of breath with ADL's;Develop more efficient breathing techniques such as purse lipped breathing and diaphragmatic breathing and practicing self-pacing with activity.       Review Luke was discharged from the PR program today due to a broken shoulder. Unfortunately, she did not meet her goals due to not yet starting the program.          Exercise Goals and Review:  Exercise Goals     Row Name 02/25/24 1044             Exercise Goals   Increase Physical Activity Yes       Intervention Provide advice, education, support and counseling about physical activity/exercise needs.;Develop an individualized exercise prescription for aerobic and resistive training based on initial evaluation findings, risk stratification, comorbidities and participant's personal goals.       Expected Outcomes Short Term: Attend rehab on a regular basis to increase amount of physical activity.;Long Term: Add in home exercise to make exercise part of routine and to increase amount of physical activity.;Long Term: Exercising regularly at least 3-5 days a week.       Increase Strength and Stamina Yes       Intervention Provide advice, education, support and counseling about physical activity/exercise needs.;Develop an individualized exercise prescription for aerobic and resistive training based on initial evaluation findings, risk stratification, comorbidities and participant's personal goals.       Expected Outcomes Short Term: Increase workloads from initial exercise prescription for resistance, speed, and METs.;Short Term: Perform resistance training exercises routinely during rehab and add in resistance training at home;Long Term: Improve cardiorespiratory fitness, muscular  endurance and strength as measured by increased METs and functional capacity ( )       Able to understand and use rate of perceived exertion (RPE) scale Yes       Intervention Provide education and explanation on how to use RPE scale       Expected Outcomes Short Term: Able to use RPE daily in rehab to express subjective intensity level;Long Term:  Able to use RPE to guide intensity level when exercising independently       Able to understand and use Dyspnea scale Yes       Intervention Provide education and explanation on how to use Dyspnea scale       Expected Outcomes Short Term: Able to use Dyspnea scale daily in rehab to express subjective sense of shortness of breath during exertion;Long Term: Able to use Dyspnea scale to guide intensity level when exercising independently       Knowledge and understanding of Target Heart Rate Range (THRR) Yes       Intervention Provide education and explanation of THRR including how the numbers were predicted  and where they are located for reference       Expected Outcomes Short Term: Able to state/look up THRR;Long Term: Able to use THRR to govern intensity when exercising independently;Short Term: Able to use daily as guideline for intensity in rehab       Understanding of Exercise Prescription Yes       Intervention Provide education, explanation, and written materials on patient's individual exercise prescription       Expected Outcomes Short Term: Able to explain program exercise prescription;Long Term: Able to explain home exercise prescription to exercise independently          Exercise Goals Re-Evaluation:   Nutrition & Weight - Outcomes:    Nutrition:   Nutrition Discharge:   Education Questionnaire Score:  Knowledge Questionnaire Score - 02/25/24 1050       Knowledge Questionnaire Score   Pre Score 17/18          Goals reviewed with patient; copy given to patient.    [1]  Social History Tobacco Use  Smoking Status  Former   Current packs/day: 1.00   Types: Cigarettes  Smokeless Tobacco Never  Tobacco Comments   Pt states she quit 05/21/22

## 2024-03-03 ENCOUNTER — Encounter: Admitting: Physical Medicine & Rehabilitation

## 2024-03-03 NOTE — Telephone Encounter (Signed)
 I called and spoke with the patient, she states she has been with [employer] since 2024.  I told her changing DME companies depends on how long you have been with the DME company and also on insurance.  I let her know I would send a message to our PCCs to get more information regarding if she can change companies.  I told her we would call her back when we had an update.  She verbalized understanding.  PCCS,  Can we change DME companies for her so she can get a POC?  She has been with [employer] since 2024.  Thank you.

## 2024-03-07 ENCOUNTER — Encounter (HOSPITAL_COMMUNITY)

## 2024-03-07 NOTE — Telephone Encounter (Signed)
 I have sent info to [employer] and inquired about switching for POC and will inform once I receive news

## 2024-03-07 NOTE — Telephone Encounter (Signed)
 Per Dolanda at [employer]-She would have to contact [employer] to have all their equipment picked up and we would need an order for o2 with pulse dose patient qualified for and qualifing sats

## 2024-03-08 NOTE — Telephone Encounter (Signed)
 ATC patient x1.  Left detailed message per DPR letting her know that for her to change DME companies, she would need an OV, qualifying walk and new oxygen  order to new DME company.  She currently has a f/u scheduled for 05/22/24 and we can schedule a sooner OV if she would like to do so.  Advised to return our call.  Will await return call.

## 2024-03-09 ENCOUNTER — Encounter (HOSPITAL_COMMUNITY)

## 2024-03-09 NOTE — Telephone Encounter (Signed)
 Copied from CRM #[id number]. Topic: Clinical - Order For Equipment >> Mar 09, 2024  9:54 AM Russell PARAS wrote: Reason for CRM:   Pt is returning call from [facility] concerning her DME company change. She was advised would need to schedule OV to have walk test performed. Does have appt scheduled in 05/2024;however, is open to coming in sooner so she can get her DME company changed.   Reviewed provider's schedule and no sooner appts visible prior to 05/2024.  Requested call back  CB# [phone]   Called and scheduled pt sooner ov for next week for pt to have qualifying walk and new oxygen  order placed to a new DME company. Nothing further needed.

## 2024-03-13 ENCOUNTER — Ambulatory Visit: Admitting: Pulmonary Disease

## 2024-03-14 ENCOUNTER — Encounter (HOSPITAL_COMMUNITY)

## 2024-03-16 ENCOUNTER — Encounter (HOSPITAL_COMMUNITY)

## 2024-03-21 ENCOUNTER — Encounter (HOSPITAL_COMMUNITY)

## 2024-03-23 ENCOUNTER — Encounter (HOSPITAL_COMMUNITY)

## 2024-03-24 ENCOUNTER — Encounter: Admitting: Physical Medicine & Rehabilitation

## 2024-03-27 ENCOUNTER — Ambulatory Visit: Admitting: Family Medicine

## 2024-03-28 ENCOUNTER — Encounter (HOSPITAL_COMMUNITY)

## 2024-03-30 ENCOUNTER — Encounter (HOSPITAL_COMMUNITY)

## 2024-04-04 ENCOUNTER — Encounter (HOSPITAL_COMMUNITY)

## 2024-04-06 ENCOUNTER — Encounter (HOSPITAL_COMMUNITY)

## 2024-04-11 ENCOUNTER — Encounter (HOSPITAL_COMMUNITY)

## 2024-04-13 ENCOUNTER — Encounter (HOSPITAL_COMMUNITY)

## 2024-04-18 ENCOUNTER — Encounter (HOSPITAL_COMMUNITY)

## 2024-04-20 ENCOUNTER — Encounter (HOSPITAL_COMMUNITY)

## 2024-04-25 ENCOUNTER — Encounter (HOSPITAL_COMMUNITY)

## 2024-04-27 ENCOUNTER — Encounter (HOSPITAL_COMMUNITY)

## 2024-05-02 ENCOUNTER — Encounter (HOSPITAL_COMMUNITY)

## 2024-05-04 ENCOUNTER — Encounter (HOSPITAL_COMMUNITY)

## 2024-05-09 ENCOUNTER — Encounter (HOSPITAL_COMMUNITY)

## 2024-05-11 ENCOUNTER — Encounter (HOSPITAL_COMMUNITY)

## 2024-05-16 ENCOUNTER — Encounter (HOSPITAL_COMMUNITY)

## 2024-05-18 ENCOUNTER — Encounter (HOSPITAL_COMMUNITY)

## 2024-05-22 ENCOUNTER — Ambulatory Visit: Admitting: Pulmonary Disease
